# Patient Record
Sex: Female | Born: 1965
Health system: Southern US, Community
[De-identification: ages and names within clinical notes are randomized; demographics above are authoritative.]

## PROBLEM LIST (undated history)

## (undated) DIAGNOSIS — R011 Cardiac murmur, unspecified: Secondary | ICD-10-CM

## (undated) DIAGNOSIS — G8929 Other chronic pain: Secondary | ICD-10-CM

## (undated) DIAGNOSIS — J849 Interstitial pulmonary disease, unspecified: Secondary | ICD-10-CM

## (undated) DIAGNOSIS — M5430 Sciatica, unspecified side: Secondary | ICD-10-CM

## (undated) DIAGNOSIS — I10 Essential (primary) hypertension: Secondary | ICD-10-CM

## (undated) DIAGNOSIS — M1990 Unspecified osteoarthritis, unspecified site: Secondary | ICD-10-CM

## (undated) DIAGNOSIS — J189 Pneumonia, unspecified organism: Secondary | ICD-10-CM

## (undated) HISTORY — PX: BACK SURGERY: SHX140

## (undated) HISTORY — PX: ABDOMINAL HYSTERECTOMY: SHX81

## (undated) HISTORY — DX: Cardiac murmur, unspecified: R01.1

---

## 2014-04-17 DIAGNOSIS — Z90711 Acquired absence of uterus with remaining cervical stump: Secondary | ICD-10-CM

## 2014-04-17 DIAGNOSIS — R87610 Atypical squamous cells of undetermined significance on cytologic smear of cervix (ASC-US): Secondary | ICD-10-CM | POA: Insufficient documentation

## 2014-04-17 HISTORY — DX: Acquired absence of uterus with remaining cervical stump: Z90.711

## 2014-04-17 HISTORY — DX: Atypical squamous cells of undetermined significance on cytologic smear of cervix (ASC-US): R87.610

## 2014-10-05 LAB — HM HEPATITIS C SCREENING LAB: HM Hepatitis Screen: NEGATIVE

## 2015-12-26 DIAGNOSIS — Z9889 Other specified postprocedural states: Secondary | ICD-10-CM | POA: Insufficient documentation

## 2015-12-26 HISTORY — DX: Other specified postprocedural states: Z98.890

## 2020-05-12 HISTORY — PX: COLONOSCOPY: SHX174

## 2021-01-16 ENCOUNTER — Emergency Department (HOSPITAL_BASED_OUTPATIENT_CLINIC_OR_DEPARTMENT_OTHER)
Admission: EM | Admit: 2021-01-16 | Discharge: 2021-01-16 | Disposition: A | Discharge status: Home / Self Care | Payer: Medicare (Managed Care) | Attending: Emergency Medicine | Admitting: Emergency Medicine

## 2021-01-16 ENCOUNTER — Other Ambulatory Visit: Payer: Self-pay

## 2021-01-16 ENCOUNTER — Encounter (HOSPITAL_BASED_OUTPATIENT_CLINIC_OR_DEPARTMENT_OTHER): Payer: Self-pay

## 2021-01-16 ENCOUNTER — Ambulatory Visit: Payer: Medicare Other

## 2021-01-16 DIAGNOSIS — B309 Viral conjunctivitis, unspecified: Secondary | ICD-10-CM

## 2021-01-16 DIAGNOSIS — I10 Essential (primary) hypertension: Secondary | ICD-10-CM | POA: Insufficient documentation

## 2021-01-16 DIAGNOSIS — H109 Unspecified conjunctivitis: Secondary | ICD-10-CM | POA: Insufficient documentation

## 2021-01-16 HISTORY — DX: Sciatica, unspecified side: M54.30

## 2021-01-16 HISTORY — DX: Interstitial pulmonary disease, unspecified: J84.9

## 2021-01-16 HISTORY — DX: Pneumonia, unspecified organism: J18.9

## 2021-01-16 HISTORY — DX: Other chronic pain: G89.29

## 2021-01-16 HISTORY — DX: Essential (primary) hypertension: I10

## 2021-01-16 HISTORY — DX: Unspecified osteoarthritis, unspecified site: M19.90

## 2021-01-16 MED ORDER — HYPROMELLOSE (GONIOSCOPIC) 2.5 % OP SOLN: 2.0000 [drp] | Freq: Three times a day (TID) | OPHTHALMIC | 12 refills | Status: DC

## 2021-01-16 NOTE — Discharge Instructions (Addendum)
Throw away all of-year-old eye make-up brushes that you have used in the last 24 hours.  He started having changes in vision, loss of vision, painful vision please return back to ED for additional evaluation. apply the artificial tears to your eye twice daily to help with symptoms.Wash your hands and keep surfaces clean as this is very contagious.

## 2021-01-16 NOTE — ED Provider Notes (Addendum)
[facility] EMERGENCY DEPARTMENT Provider Note   CSN: [id number] Arrival date & time: 01/16/21  1133     History Chief Complaint  Patient presents with   Conjunctivitis    Caitlin White is a 55 y.o. female.   Conjunctivitis   Patient presents with right red eye.  States that she woke up this morning with erythema and crusty discharge on her right eye.  It is not painful or itchy, but is associated with tearing.  States that her daughter had pinkeye a week ago.  She does not wear any contacts, she is not having any vision changes, no blurry vision, no headaches.  He is not having any other associated symptoms.  Past Medical History:  Diagnosis Date   Arthritis    Chronic pain    Hypertension    Interstitial lung disease ([place])    Pneumonia    Sciatica     There are no problems to display for this patient.   Past Surgical History:  Procedure Laterality Date   ABDOMINAL HYSTERECTOMY     BACK SURGERY       OB History   No obstetric history on file.     No family history on file.  Social History   Tobacco Use   Smoking status: Never   Smokeless tobacco: Never  Vaping Use   Vaping Use: Never used  Substance Use Topics   Alcohol use: Never   Drug use: Never    Home Medications Prior to Admission medications   Not on File    Allergies    Patient has no known allergies.  Review of Systems   Review of Systems  Constitutional:  Negative for fever.  Eyes:  Positive for discharge and redness. Negative for pain and itching.   Physical Exam Updated Vital Signs BP (!) 156/77 (BP Location: Right Arm)   Pulse 77   Temp 98.5 F (36.9 C) (Oral)   Resp 20   Ht '6\' 1"'$  (1.854 m)   Wt 122 kg   SpO2 100%   BMI 35.49 kg/m   Physical Exam Vitals and nursing note reviewed. Exam conducted with a chaperone present.  Constitutional:      General: She is not in acute distress.    Appearance: Normal appearance.  HENT:     Head: Normocephalic and  atraumatic.  Eyes:     General: Lids are normal. Vision grossly intact. No scleral icterus.       Right eye: No discharge.     Extraocular Movements: Extraocular movements intact.     Right eye: Normal extraocular motion and no nystagmus.     Left eye: Normal extraocular motion and no nystagmus.     Conjunctiva/sclera:     Right eye: Right conjunctiva is injected. No exudate.    Left eye: Left conjunctiva is not injected. No chemosis or exudate.    Pupils: Pupils are equal, round, and reactive to light.     Comments: Conjunctival injection to the right.  No discharge, no chemosis.  Intact to confrontation, visual fields intact.  Lid is atraumatic.  Skin:    Coloration: Skin is not jaundiced.  Neurological:     Mental Status: She is alert. Mental status is at baseline.     Coordination: Coordination normal.    ED Results / Procedures / Treatments   Labs (all labs ordered are listed, but only abnormal results are displayed) Labs Reviewed - No data to display  EKG None  Radiology No results found.  Procedures Procedures   Medications Ordered in ED Medications - No data to display  ED Course  I have reviewed the triage vital signs and the nursing notes.  Pertinent labs & imaging results that were available during my care of the patient were reviewed by me and considered in my medical decision making (see chart for details).    MDM Rules/Calculators/A&P                           Patient vitals are stable, she is not having any visual deficits.  She not having any eye pain or discharge at the moment.  Additionally no tearing or itching consistent with an allergic conjunctivitis.  I suspect this is most likely a viral conjunctivitis given that it started in one eye and is without mucopurulent discharge.  Additionally, patient tells me her daughter had the same symptoms a week ago and it took 2 weeks to resolve.  The daughter was applying erythromycin ointment without  significant reduction in time of symptoms, this further suggest to me that this is a viral conjunctivitis.  No rashes or history of herpes zoster.  No blepharitis, no chemosis.  Lid is atraumatic so no concern for globe rupture.  Do not suspect glaucoma or iritis.  We will treat patient symptomatically for viral conjunctivitis and have her follow-up if needed.  Final Clinical Impression(s) / ED Diagnoses Final diagnoses:  None    Rx / DC Orders ED Discharge Orders     None        Sherrill Raring, PA-C 01/16/21 1219    Sherrill Raring, PA-C 01/16/21 1423    Godfrey Pick, MD 01/17/21 [phone]

## 2021-01-16 NOTE — ED Triage Notes (Signed)
Pt state she woke this am with redness/drainage to right eye-NAD-steady gait

## 2021-02-24 ENCOUNTER — Emergency Department (HOSPITAL_BASED_OUTPATIENT_CLINIC_OR_DEPARTMENT_OTHER): Payer: Medicare HMO

## 2021-02-24 ENCOUNTER — Emergency Department (HOSPITAL_BASED_OUTPATIENT_CLINIC_OR_DEPARTMENT_OTHER)
Admission: EM | Admit: 2021-02-24 | Discharge: 2021-02-24 | Disposition: A | Discharge status: Home / Self Care | Payer: Medicare HMO | Attending: Emergency Medicine | Admitting: Emergency Medicine

## 2021-02-24 ENCOUNTER — Other Ambulatory Visit: Payer: Self-pay

## 2021-02-24 DIAGNOSIS — H1033 Unspecified acute conjunctivitis, bilateral: Secondary | ICD-10-CM

## 2021-02-24 DIAGNOSIS — R109 Unspecified abdominal pain: Secondary | ICD-10-CM | POA: Diagnosis not present

## 2021-02-24 DIAGNOSIS — H5789 Other specified disorders of eye and adnexa: Secondary | ICD-10-CM | POA: Insufficient documentation

## 2021-02-24 DIAGNOSIS — R6883 Chills (without fever): Secondary | ICD-10-CM | POA: Diagnosis not present

## 2021-02-24 DIAGNOSIS — R5381 Other malaise: Secondary | ICD-10-CM | POA: Diagnosis not present

## 2021-02-24 DIAGNOSIS — I10 Essential (primary) hypertension: Secondary | ICD-10-CM | POA: Insufficient documentation

## 2021-02-24 DIAGNOSIS — R110 Nausea: Secondary | ICD-10-CM | POA: Insufficient documentation

## 2021-02-24 DIAGNOSIS — H1031 Unspecified acute conjunctivitis, right eye: Secondary | ICD-10-CM | POA: Diagnosis not present

## 2021-02-24 LAB — COMPREHENSIVE METABOLIC PANEL
ALT: 20 U/L (ref 0–44)
AST: 19 U/L (ref 15–41)
Albumin: 3.8 g/dL (ref 3.5–5.0)
Alkaline Phosphatase: 63 U/L (ref 38–126)
Anion gap: 5 (ref 5–15)
BUN: 11 mg/dL (ref 6–20)
CO2: 29 mmol/L (ref 22–32)
Calcium: 8.9 mg/dL (ref 8.9–10.3)
Chloride: 100 mmol/L (ref 98–111)
Creatinine, Ser: 0.88 mg/dL (ref 0.44–1.00)
GFR, Estimated: >60 mL/min (ref >60)
Glucose, Bld: 95 mg/dL (ref 70–99)
Potassium: 3.7 mmol/L (ref 3.5–5.1)
Sodium: 134 mmol/L — ABNORMAL LOW (ref 135–145)
Total Bilirubin: 0.2 mg/dL — ABNORMAL LOW (ref 0.3–1.2)
Total Protein: 7.8 g/dL (ref 6.5–8.1)

## 2021-02-24 LAB — URINALYSIS, ROUTINE W REFLEX MICROSCOPIC
Bilirubin Urine: NEGATIVE
Glucose, UA: NEGATIVE mg/dL
Hgb urine dipstick: NEGATIVE
Ketones, ur: NEGATIVE mg/dL
Leukocytes,Ua: NEGATIVE
Nitrite: NEGATIVE
Protein, ur: NEGATIVE mg/dL
Specific Gravity, Urine: 1.02 (ref 1.005–1.030)
pH: 7 (ref 5.0–8.0)

## 2021-02-24 LAB — CBC
HCT: 34.3 % — ABNORMAL LOW (ref 36.0–46.0)
Hemoglobin: 11.7 g/dL — ABNORMAL LOW (ref 12.0–15.0)
MCH: 31.9 pg (ref 26.0–34.0)
MCHC: 34.1 g/dL (ref 30.0–36.0)
MCV: 93.5 fL (ref 80.0–100.0)
Platelets: 321 10*3/uL (ref 150–400)
RBC: 3.67 MIL/uL — ABNORMAL LOW (ref 3.87–5.11)
RDW: 12.8 % (ref 11.5–15.5)
WBC: 5.1 10*3/uL (ref 4.0–10.5)
nRBC: 0 % (ref 0.0–0.2)

## 2021-02-24 LAB — LIPASE, BLOOD: Lipase: 26 U/L (ref 11–51)

## 2021-02-24 MED ORDER — METOPROLOL SUCCINATE ER 25 MG PO TB24: 50.0000 mg | ORAL_TABLET | Freq: Every day | ORAL | 0 refills | Status: DC

## 2021-02-24 MED ORDER — LACTATED RINGERS IV BOLUS
1000.0000 mL | Freq: Once | INTRAVENOUS | Status: AC
  Administered 2021-02-24: 1000 mL via INTRAVENOUS

## 2021-02-24 MED ORDER — NIFEDIPINE ER OSMOTIC RELEASE 60 MG PO TB24: 60.0000 mg | ORAL_TABLET | Freq: Every day | ORAL | 0 refills | Status: DC

## 2021-02-24 MED ORDER — LEVOTHYROXINE SODIUM 175 MCG PO TABS: 175.0000 ug | ORAL_TABLET | Freq: Every day | ORAL | 0 refills | Status: DC

## 2021-02-24 MED ORDER — IOHEXOL 300 MG/ML  SOLN
100.0000 mL | Freq: Once | INTRAMUSCULAR | Status: AC | PRN
  Administered 2021-02-24: 100 mL via INTRAVENOUS

## 2021-02-24 MED ORDER — ERYTHROMYCIN 5 MG/GM OP OINT: TOPICAL_OINTMENT | OPHTHALMIC | 0 refills | Status: DC

## 2021-02-24 MED ORDER — ONDANSETRON HCL 4 MG/2ML IJ SOLN
4.0000 mg | Freq: Once | INTRAMUSCULAR | Status: AC
  Administered 2021-02-24: 4 mg via INTRAVENOUS
  Filled 2021-02-24: qty 2

## 2021-02-24 MED ORDER — ONDANSETRON 4 MG PO TBDP: 4.0000 mg | ORAL_TABLET | Freq: Three times a day (TID) | ORAL | 0 refills | Status: DC | PRN

## 2021-02-24 MED ORDER — POTASSIUM CHLORIDE CRYS ER 20 MEQ PO TBCR: 20.0000 meq | EXTENDED_RELEASE_TABLET | Freq: Every day | ORAL | 0 refills | Status: DC

## 2021-02-24 NOTE — ED Notes (Signed)
Pt NAD, a/ox4. Pt verbalizes understanding of all DC and f/u instructions. All questions answered. Pt walks with steady gait to lobby at DC.  ? ?

## 2021-02-24 NOTE — ED Provider Notes (Signed)
[facility] EMERGENCY DEPARTMENT Provider Note   CSN: [id number] Arrival date & time: 02/24/21  1639     History Chief Complaint  Patient presents with   Nausea    Caitlin White is a 55 y.o. female Who presents with 3 days of nausea, bilateral eye itching and crusting worse with morning, and generalized malaise.  She denies any vomiting, diarrhea, fevers but does endorse chills.  I personally read this patient's medical records.  She is history of hypertension, sciatica, interstitial lung disease, and chronic back pain.  HPI     Past Medical History:  Diagnosis Date   Arthritis    Chronic pain    Hypertension    Interstitial lung disease ([place])    Pneumonia    Sciatica     There are no problems to display for this patient.   Past Surgical History:  Procedure Laterality Date   ABDOMINAL HYSTERECTOMY     BACK SURGERY       OB History   No obstetric history on file.     No family history on file.  Social History   Tobacco Use   Smoking status: Never   Smokeless tobacco: Never  Vaping Use   Vaping Use: Never used  Substance Use Topics   Alcohol use: Never   Drug use: Never    Home Medications Prior to Admission medications   Medication Sig Start Date End Date Taking? Authorizing Provider  hydroxypropyl methylcellulose / hypromellose (ISOPTO TEARS / GONIOVISC) 2.5 % ophthalmic solution Place 2 drops into both eyes 3 (three) times daily. 01/16/21   Sherrill Raring, PA-C    Allergies    Patient has no known allergies.  Review of Systems   Review of Systems  Constitutional:  Positive for activity change, appetite change, chills and fatigue. Negative for fever.  HENT:  Positive for congestion. Negative for trouble swallowing and voice change.   Eyes:  Positive for discharge and itching.  Respiratory: Negative.    Cardiovascular: Negative.   Gastrointestinal:  Positive for abdominal pain and nausea. Negative for diarrhea and vomiting.   Genitourinary: Negative.   Musculoskeletal: Negative.   Skin: Negative.   Neurological: Negative.    Physical Exam Updated Vital Signs BP (!) 152/70   Pulse 63   Temp 98.1 F (36.7 C) (Oral)   Resp 18   Ht 6\' 1"  (1.854 m)   Wt 121.6 kg   SpO2 100%   BMI 35.36 kg/m   Physical Exam Vitals and nursing note reviewed.  Constitutional:      Appearance: She is obese. She is not ill-appearing or toxic-appearing.  HENT:     Head: Normocephalic and atraumatic.     Nose: Nose normal. No congestion.     Mouth/Throat:     Mouth: Mucous membranes are moist.     Pharynx: Oropharynx is clear. Uvula midline. No oropharyngeal exudate or posterior oropharyngeal erythema.     Tonsils: No tonsillar exudate.  Eyes:     General: Lids are normal. Vision grossly intact.        Right eye: No discharge.        Left eye: No discharge.     Extraocular Movements: Extraocular movements intact.     Conjunctiva/sclera:     Right eye: Right conjunctiva is injected. Chemosis present.     Left eye: Left conjunctiva is injected.     Pupils: Pupils are equal, round, and reactive to light.  Neck:     Trachea: Trachea and phonation  normal.  Cardiovascular:     Rate and Rhythm: Normal rate and regular rhythm.     Pulses: Normal pulses.     Heart sounds: Normal heart sounds. No murmur heard. Pulmonary:     Effort: Pulmonary effort is normal. No tachypnea, bradypnea, accessory muscle usage, prolonged expiration or respiratory distress.     Breath sounds: Normal breath sounds. No wheezing or rales.  Chest:     Chest wall: No mass, lacerations, deformity, swelling, tenderness, crepitus or edema.  Abdominal:     General: Bowel sounds are normal. There is no distension.     Palpations: Abdomen is soft.     Tenderness: There is generalized abdominal tenderness. There is no right CVA tenderness, left CVA tenderness, guarding or rebound.  Musculoskeletal:        General: No deformity.     Cervical back:  Normal range of motion and neck supple. No edema, rigidity, tenderness or crepitus. No pain with movement, spinous process tenderness or muscular tenderness.     Right lower leg: No edema.     Left lower leg: No edema.  Lymphadenopathy:     Cervical: No cervical adenopathy.  Skin:    General: Skin is warm and dry.     Capillary Refill: Capillary refill takes less than 2 seconds.     Findings: No rash.  Neurological:     General: No focal deficit present.     Mental Status: She is alert and oriented to person, place, and time. Mental status is at baseline.     GCS: GCS eye subscore is 4. GCS verbal subscore is 5. GCS motor subscore is 6.     Gait: Gait is intact. Gait normal.  Psychiatric:        Mood and Affect: Mood normal.    ED Results / Procedures / Treatments   Labs (all labs ordered are listed, but only abnormal results are displayed) Labs Reviewed  COMPREHENSIVE METABOLIC PANEL - Abnormal; Notable for the following components:      Result Value   Sodium 134 (*)    Total Bilirubin 0.2 (*)    All other components within normal limits  CBC - Abnormal; Notable for the following components:   RBC 3.67 (*)    Hemoglobin 11.7 (*)    HCT 34.3 (*)    All other components within normal limits  LIPASE, BLOOD  URINALYSIS, ROUTINE W REFLEX MICROSCOPIC    EKG None  Radiology CT ABDOMEN PELVIS W CONTRAST  Result Date: 02/24/2021 CLINICAL DATA:  Nausea and abdominal pain. EXAM: CT ABDOMEN AND PELVIS WITH CONTRAST TECHNIQUE: Multidetector CT imaging of the abdomen and pelvis was performed using the standard protocol following bolus administration of intravenous contrast. CONTRAST:  171mL OMNIPAQUE IOHEXOL 300 MG/ML  SOLN COMPARISON:  None. FINDINGS: Lower chest: 3 mm right lower lobe pulmonary nodule on image 12/4. 3 mm left lower lobe pulmonary nodule on image 14/4. Hepatobiliary: No suspicious hepatic lesion. Gallbladder is unremarkable. No biliary ductal dilation. Pancreas: No  pancreatic ductal dilation or evidence of acute inflammation. Spleen: Within normal limits. Adrenals/Urinary Tract: Adrenal glands are unremarkable. Kidneys are normal, without renal calculi, solid enhancing lesion, or hydronephrosis. Bladder is unremarkable for degree of distension. Stomach/Bowel: Postsurgical changes of prior gastric surgery. Small hiatal hernia otherwise the stomach is unremarkable. No pathologic dilation of small or large bowel. The appendix and terminal ileum appear normal. Scattered sigmoid colonic diverticulosis without findings of acute diverticulitis. Vascular/Lymphatic: No abdominal aortic aneurysm. No pathologically enlarged abdominal  or pelvic lymph nodes. Reproductive: Small bilateral simple appearing ovarian cysts measuring 2.3 cm on the right and 1.8 cm on the left. Uterus is unremarkable. Other: No abdominopelvic ascites. Small fat containing ventral hernia. Musculoskeletal: T9-L1 posterior spinal fusion hardware multilevel degenerative changes spine. Chronic changes of osteitis pubis. There are multifocal sclerotic osseous lesions for instance in the left posterior T7 vertebral body extending into the posterior elements on coronal image 34/5, the left ischium on coronal image 34/5, and S1 vertebral body on image 66/6. There is dense sclerosis of the left T11 pedicle/posterior elements. IMPRESSION: 1. No acute findings in the abdomen or pelvis. 2. Multiple sclerotic osseous lesions as described above, nonspecific but possibly sequela of chronic underlying metabolic disease or reflecting multifocal osseous metastatic disease. Consider further evaluation with nonemergent MRI spine with and without contrast as clinically indicated. 3. Scattered sigmoid colonic diverticulosis without findings of acute diverticulitis. 4. Multiple pulmonary nodules. Measuring up to 3 mm. No routine follow-up imaging is recommended per Fleischner Society Guidelines. These guidelines do not apply to  immunocompromised patients and patients with cancer. Follow up in patients with significant comorbidities as clinically warranted. For lung cancer screening, adhere to Lung-RADS guidelines. Reference: Radiology. 2017; 284(1):228-43. 5. Multiple simple appearing ovarian cysts measuring up to 2.3 cm. No follow-up imaging is recommended. Reference: JACR 2020 Feb;17(2):248-254 Electronically Signed   By: Dahlia Bailiff M.D.   On: 02/24/2021 20:54    Procedures Procedures   Medications Ordered in ED Medications  lactated ringers bolus 1,000 mL ( Intravenous Stopped 02/24/21 2137)  ondansetron (ZOFRAN) injection 4 mg (4 mg Intravenous Given 02/24/21 1946)  iohexol (OMNIPAQUE) 300 MG/ML solution 100 mL (100 mLs Intravenous Contrast Given 02/24/21 2025)    ED Course  I have reviewed the triage vital signs and the nursing notes.  Pertinent labs & imaging results that were available during my care of the patient were reviewed by me and considered in my medical decision making (see chart for details).    MDM Rules/Calculators/A&P                         53 times a day 3 days of nausea, bilateral itching and drainage from the eyes, generalized malaise.  Diagnoses broad includes but is not limited to acute viral upper respiratory infection, gastroenteritis, sepsis, dehydration/hypovolemia.  Hypertensive on intake and vital signs otherwise normal.  Cardiopulmonary exam is normal, abdominal exam is generally tender to palpation without rebound or guarding.  Bilateral conjunctive are injected with chemosis from the right eye.  Patient is neurovascular intact in all 4 extremities and ambulates in the emergency department out difficulty.  No focal deficit on neuro exam.  CBC with mild anemia with hemoglobin of 11.7.  CMP with mild hyponatremia 134, lipase is normal.  Urine noninfectious appearing.  CT without acute finding in the abdomen or pelvis.  Some sclerotic lesions of the spine which should be  followed up with further imaging in the outpatient setting.  Patient reevaluated after fluid bolus and antiemetic with improvement in her symptoms.  No further work-up warranted in the ER at this time.  Will discharge with prescriptions for her home medications which she is out of (patient relocating from [place]).  Additionally will discharge with as needed Zofran prescription.  Patient to see her new primary care doctor on 03/11/2021.  Suspect acute viral illness as the etiology of her symptoms.  No further work-up warranted in the ER at this time.  Zahraa voiced understanding of her medical evaluation and treatment plan.  Each of her questions was answered to her expressed satisfaction.  Return precautions were given.  Patient is well-appearing, stable, and appropriate for discharge at this time.  This chart was dictated using voice recognition software, Dragon. Despite the best efforts of this provider to proofread and correct errors, errors may still occur which can change documentation meaning.   Final Clinical Impression(s) / ED Diagnoses Final diagnoses:  None    Rx / DC Orders ED Discharge Orders     None        Aura Dials 02/24/21 2217    Godfrey Pick, MD 02/26/21 1551

## 2021-02-24 NOTE — Discharge Instructions (Addendum)
You are seen in the ER today for your nausea and your eye discharge.  You are diagnosed with acute viral illness.  You have been prescribed antibiotic ointment to treat your eye infection.  Additionally been prescribed as needed Zofran which is a nausea medication.  Your home medications were refilled in the ER.  Please follow-up with your PCP as scheduled for 03/11/2021 and return to the ER with any new severe symptoms.  Increase your hydration at home.

## 2021-02-24 NOTE — ED Triage Notes (Signed)
Pt states she thinks she has pink eye in her right eye. Has crust and itching. Also c/o nausea. States she is out of her potassium and nifedepine

## 2021-02-28 DIAGNOSIS — H524 Presbyopia: Secondary | ICD-10-CM | POA: Diagnosis not present

## 2021-02-28 DIAGNOSIS — Z0100 Encounter for examination of eyes and vision without abnormal findings: Secondary | ICD-10-CM | POA: Diagnosis not present

## 2021-03-11 DIAGNOSIS — R946 Abnormal results of thyroid function studies: Secondary | ICD-10-CM | POA: Diagnosis not present

## 2021-03-11 DIAGNOSIS — Z13220 Encounter for screening for lipoid disorders: Secondary | ICD-10-CM | POA: Diagnosis not present

## 2021-03-11 DIAGNOSIS — Z6836 Body mass index (BMI) 36.0-36.9, adult: Secondary | ICD-10-CM | POA: Diagnosis not present

## 2021-03-11 DIAGNOSIS — Z1231 Encounter for screening mammogram for malignant neoplasm of breast: Secondary | ICD-10-CM | POA: Diagnosis not present

## 2021-03-11 DIAGNOSIS — Z1211 Encounter for screening for malignant neoplasm of colon: Secondary | ICD-10-CM | POA: Diagnosis not present

## 2021-03-11 DIAGNOSIS — Z0000 Encounter for general adult medical examination without abnormal findings: Secondary | ICD-10-CM | POA: Diagnosis not present

## 2021-03-11 DIAGNOSIS — E669 Obesity, unspecified: Secondary | ICD-10-CM | POA: Diagnosis not present

## 2021-03-11 DIAGNOSIS — I10 Essential (primary) hypertension: Secondary | ICD-10-CM | POA: Diagnosis not present

## 2021-03-11 DIAGNOSIS — Z23 Encounter for immunization: Secondary | ICD-10-CM | POA: Diagnosis not present

## 2021-03-11 DIAGNOSIS — Z124 Encounter for screening for malignant neoplasm of cervix: Secondary | ICD-10-CM | POA: Diagnosis not present

## 2021-03-11 DIAGNOSIS — E890 Postprocedural hypothyroidism: Secondary | ICD-10-CM | POA: Diagnosis not present

## 2021-03-11 DIAGNOSIS — Z981 Arthrodesis status: Secondary | ICD-10-CM | POA: Diagnosis not present

## 2021-03-15 ENCOUNTER — Institutional Professional Consult (permissible substitution): Payer: Medicare Other | Admitting: Pulmonary Disease

## 2021-03-20 ENCOUNTER — Other Ambulatory Visit: Payer: Self-pay

## 2021-03-20 ENCOUNTER — Encounter: Payer: Self-pay | Admitting: Pulmonary Disease

## 2021-03-20 ENCOUNTER — Ambulatory Visit (INDEPENDENT_AMBULATORY_CARE_PROVIDER_SITE_OTHER): Payer: Medicare HMO | Admitting: Pulmonary Disease

## 2021-03-20 VITALS — BP 124/64 | HR 78 | Temp 98.5°F | Ht 73.0 in | Wt 271.4 lb

## 2021-03-20 DIAGNOSIS — J849 Interstitial pulmonary disease, unspecified: Secondary | ICD-10-CM | POA: Diagnosis not present

## 2021-03-20 NOTE — Progress Notes (Addendum)
Caitlin White    [id number]    11/03/1965  Primary Care Physician:Pcp, No  Referring Physician: No referring provider defined for this encounter.  Chief complaint: Consult for interstitial lung disease  HPI: 55 year old with history of unspecified interstitial lung disease, sleep apnea She has moved here from [place] and is here to establish care Previously followed by Dr. Nyra Jabs in [place], [place]  As per the patient she had severe bilateral pneumonia in 2013 requiring hospitalizations and fluid drainage.  She developed interstitial lung disease after that but does not recall the specific details.  She has been followed by pulmonary since then for monitoring and is not on any specific treatment History also notable for sleep apnea and has been on CPAP for the past 3 years  At present denies any dyspnea, fevers, chills  Pets: No pets Occupation: Disabled since 2021.  Used to work as a Engineer, manufacturing Exposures: No mold, hot tub, Jacuzzi.  No feather pillows or comforters Smoking history: Never smoker Travel history: Originally from [place].  Moved to [place] in 2022 Relevant family history: No significant family history of lung disease  Outpatient Encounter Medications as of 03/20/2021  Medication Sig   levothyroxine (SYNTHROID) 175 MCG tablet Take 1 tablet (175 mcg total) by mouth daily before breakfast.   losartan (COZAAR) 100 MG tablet Take by mouth.   metoprolol succinate (TOPROL-XL) 25 MG 24 hr tablet Take 2 tablets (50 mg total) by mouth daily.   NIFEdipine (PROCARDIA XL/NIFEDICAL XL) 60 MG 24 hr tablet Take 1 tablet (60 mg total) by mouth daily.   omeprazole (PRILOSEC) 20 MG capsule Take 20 mg by mouth daily.   potassium chloride SA (KLOR-CON) 20 MEQ tablet Take 1 tablet (20 mEq total) by mouth daily for 15 days.   VITAMIN D PO Take by mouth.   [DISCONTINUED] erythromycin ophthalmic ointment Place a 1/2 inch ribbon of ointment into the  lower eyelid four times daily x 5 days.   [DISCONTINUED] hydroxypropyl methylcellulose / hypromellose (ISOPTO TEARS / GONIOVISC) 2.5 % ophthalmic solution Place 2 drops into both eyes 3 (three) times daily.   [DISCONTINUED] ondansetron (ZOFRAN ODT) 4 MG disintegrating tablet Take 1 tablet (4 mg total) by mouth every 8 (eight) hours as needed for nausea or vomiting.   No facility-administered encounter medications on file as of 03/20/2021.    Allergies as of 03/20/2021   (No Known Allergies)    Past Medical History:  Diagnosis Date   Arthritis    Chronic pain    Hypertension    Interstitial lung disease ([place])    Pneumonia    Sciatica     Past Surgical History:  Procedure Laterality Date   ABDOMINAL HYSTERECTOMY     BACK SURGERY      No family history on file.  Social History   Socioeconomic History   Marital status: Married    Spouse name: Not on file   Number of children: Not on file   Years of education: Not on file   Highest education level: Not on file  Occupational History   Not on file  Tobacco Use   Smoking status: Never   Smokeless tobacco: Never  Vaping Use   Vaping Use: Never used  Substance and Sexual Activity   Alcohol use: Never   Drug use: Never   Sexual activity: Not on file  Other Topics Concern   Not on file  Social History  Narrative   Not on file   Social Determinants of Health   Financial Resource Strain: Not on file  Food Insecurity: Not on file  Transportation Needs: Not on file  Physical Activity: Not on file  Stress: Not on file  Social Connections: Not on file  Intimate Partner Violence: Not on file    Review of systems: Review of Systems  Constitutional: Negative for fever and chills.  HENT: Negative.   Eyes: Negative for blurred vision.  Respiratory: as per HPI  Cardiovascular: Negative for chest pain and palpitations.  Gastrointestinal: Negative for vomiting, diarrhea, blood per rectum. Genitourinary: Negative for  dysuria, urgency, frequency and hematuria.  Musculoskeletal: Negative for myalgias, back pain and joint pain.  Skin: Negative for itching and rash.  Neurological: Negative for dizziness, tremors, focal weakness, seizures and loss of consciousness.  Endo/Heme/Allergies: Negative for environmental allergies.  Psychiatric/Behavioral: Negative for depression, suicidal ideas and hallucinations.  All other systems reviewed and are negative.  Physical Exam: Blood pressure 124/64, pulse 78, temperature 98.5 F (36.9 C), temperature source Oral, height 6\' 1"  (1.854 m), weight 271 lb 6.4 oz (123.1 kg), SpO2 99 %. Gen:      No acute distress HEENT:  EOMI, sclera anicteric Neck:     No masses; no thyromegaly Lungs:    Clear to auscultation bilaterally; normal respiratory effort CV:         Regular rate and rhythm; no murmurs Abd:      + bowel sounds; soft, non-tender; no palpable masses, no distension Ext:    No edema; adequate peripheral perfusion Skin:      Warm and dry; no rash Neuro: alert and oriented x 3 Psych: normal mood and affect  Data Reviewed: Imaging: CT abdomen pelvis 02/24/2021-visualized lung bases are clear.  Multiple pulmonary nodules measuring up to 3 mm.  I have reviewed the images personally.  PFTs:  Labs:  Assessment:  Evaluation of interstitial lung disease She has unspecified interstitial lung disease after hospitalization for pneumonia in 2013 Not currently on any treatment Will need to get records from her pulmonary doctor in [place]  Schedule high-res CT and PFTs for evaluation of the lungs  OSA On CPAP Obtain records of sleep study  Plan/Recommendations: Obtain records High-res CT, PFTs  Marshell Garfinkel MD [facility] 03/20/2021, 3:33 PM  CC: No ref. provider found  Addendum: Received clinic notes from patient's pulmonologist Dr. Modena Nunnery dated 07/18/2020.  Review of anxiety  Patient has history of interstitial  lung disease, nonspecific ILD.   \Open lung biopsy right lower lobe on 06/10/2012, she completed a long course of steroids with complete resolution of infiltrates  Core IR biopsy of right middle lobe nodule on 08/17/2015-patchy chronic interstitial inflammation and alveolar histiocytes, negative for granulomata, vasculitis or neoplasm.  She completed 2 months of prednisone from April to May 2017  She follows Dr. Chriss Czar for evaluation of numeric hyperintense of lesions affecting vertebral bodies which is stable for 4 years.  History of total thyroidectomy for thyroid nodules in November 2020  Has history of OSA on CPAP  Marshell Garfinkel MD [facility] 05/22/2021, 1:34 PM

## 2021-03-20 NOTE — Patient Instructions (Signed)
We get records from your previous pulmonary doctor for our review Get high-res CT and PFTs for better evaluation of lung Follow-up in 1 to 2 months

## 2021-04-02 ENCOUNTER — Ambulatory Visit (INDEPENDENT_AMBULATORY_CARE_PROVIDER_SITE_OTHER)
Admission: RE | Admit: 2021-04-02 | Discharge: 2021-04-02 | Disposition: A | Discharge status: Home / Self Care | Payer: Medicare HMO | Source: Ambulatory Visit | Attending: Pulmonary Disease | Admitting: Pulmonary Disease

## 2021-04-02 ENCOUNTER — Other Ambulatory Visit: Payer: Self-pay

## 2021-04-02 DIAGNOSIS — J8410 Pulmonary fibrosis, unspecified: Secondary | ICD-10-CM | POA: Diagnosis not present

## 2021-04-02 DIAGNOSIS — C7951 Secondary malignant neoplasm of bone: Secondary | ICD-10-CM | POA: Diagnosis not present

## 2021-04-02 DIAGNOSIS — J849 Interstitial pulmonary disease, unspecified: Secondary | ICD-10-CM

## 2021-04-02 DIAGNOSIS — K449 Diaphragmatic hernia without obstruction or gangrene: Secondary | ICD-10-CM | POA: Diagnosis not present

## 2021-04-08 ENCOUNTER — Other Ambulatory Visit: Payer: Self-pay | Admitting: *Deleted

## 2021-04-08 DIAGNOSIS — R9389 Abnormal findings on diagnostic imaging of other specified body structures: Secondary | ICD-10-CM

## 2021-04-08 DIAGNOSIS — M899 Disorder of bone, unspecified: Secondary | ICD-10-CM

## 2021-04-08 NOTE — Addendum Note (Signed)
Addended by: Elton Sin on: 04/08/2021 11:33 AM   Modules accepted: Orders

## 2021-04-17 ENCOUNTER — Other Ambulatory Visit: Payer: Self-pay

## 2021-04-17 ENCOUNTER — Ambulatory Visit (HOSPITAL_COMMUNITY)
Admission: RE | Admit: 2021-04-17 | Discharge: 2021-04-17 | Disposition: A | Discharge status: Home / Self Care | Payer: Medicare HMO | Source: Ambulatory Visit | Attending: Pulmonary Disease | Admitting: Pulmonary Disease

## 2021-04-17 DIAGNOSIS — C7951 Secondary malignant neoplasm of bone: Secondary | ICD-10-CM | POA: Diagnosis not present

## 2021-04-17 DIAGNOSIS — R590 Localized enlarged lymph nodes: Secondary | ICD-10-CM | POA: Diagnosis not present

## 2021-04-17 DIAGNOSIS — M899 Disorder of bone, unspecified: Secondary | ICD-10-CM | POA: Diagnosis not present

## 2021-04-17 DIAGNOSIS — R918 Other nonspecific abnormal finding of lung field: Secondary | ICD-10-CM | POA: Diagnosis not present

## 2021-04-17 DIAGNOSIS — R9389 Abnormal findings on diagnostic imaging of other specified body structures: Secondary | ICD-10-CM | POA: Diagnosis not present

## 2021-04-17 DIAGNOSIS — M47814 Spondylosis without myelopathy or radiculopathy, thoracic region: Secondary | ICD-10-CM | POA: Diagnosis not present

## 2021-04-17 LAB — GLUCOSE, CAPILLARY: Glucose-Capillary: 106 mg/dL — ABNORMAL HIGH (ref 70–99)

## 2021-04-17 MED ORDER — FLUDEOXYGLUCOSE F - 18 (FDG) INJECTION
13.5000 | Freq: Once | INTRAVENOUS | Status: AC | PRN
  Administered 2021-04-17: 14 via INTRAVENOUS

## 2021-04-22 ENCOUNTER — Telehealth: Payer: Self-pay | Admitting: Pulmonary Disease

## 2021-04-22 DIAGNOSIS — M899 Disorder of bone, unspecified: Secondary | ICD-10-CM

## 2021-04-22 NOTE — Telephone Encounter (Signed)
I called to discuss PET scan results from 04/18/21 but there was no answer and voice mail box was full.   Can you please try again and let her know that scan shows some lesion in the bones that is indeterminate. I would like her to see Oncology to get their opinion. Please make a referral

## 2021-04-23 ENCOUNTER — Telehealth: Payer: Self-pay | Admitting: Oncology

## 2021-04-23 NOTE — Telephone Encounter (Signed)
[facility] Scheduling Phone Note  I contacted Caitlin White regarding a referral from Dr. Vaughan Browner for purpose of evaluation and work up of bone lesions.  Caitlin White was aware of her referral and reason.  Information on reason for referral was not necessary.  Of note, she did sound like I had awoken her with my call - I did ask her to confirm back the findings as she understood them resulting in the reason for referral.  Caitlin White is very pleasant and knowledgeable about what prompted the referral to hematology/oncology.  Patient was informed about the [facility] and the intent being to complete a diagnostic/prognostic work-up for her suspicious findings.  She is aware her appointment is with our Physician Assistant to identify a diagnostic plan of care and to arrange further oncologist or specialist care as indicated.  I confirmed with Judi Jaffe she has transportation to her [facility] appointment.  Patient is aware to bring a list of medications, as well as insurance cards.  Visitor policy and COVID protocol reviewed with patient as well, including what to expect on arrival to the [facility] regarding check-in process, visit, and potential for labs afterwards.  We look forward to meeting Caitlin White and completing her diagnostic work-up and arranging her subsequent appointment with our oncologist team.  Diagnostic Clinic Specific Info:  Best contact/way to contact patient:  Home/Mobile CHL updated to reflect?:  not applicable Is there a HC POA?:  No  Location of Diagnostic Clinic Appointment and Contact Info Provided to Patient: [place] [facility] [place], [place] [address] [phone]   Reason for Referral, Clinical Information:  04/02/2021 CT Chest IMPRESSION: 1. No evidence of interstitial lung disease. 2. Scattered sclerotic osseous lesions are worrisome for  metastatic disease. A site of primary malignancy is not identified in the chest. 3. Noncalcified pulmonary nodules measure 5 mm or less in size and are indeterminate. Recommend continued attention on follow-up exams as metastatic disease cannot be definitively excluded.  04/17/2021 PET IMPRESSION: Multifocal areas of bony sclerosis without substantial increased metabolic activity above mediastinal blood pool. Findings are indeterminate; however, based on distribution and appearance remaining suspicious for metastatic disease perhaps from an indolent process. Occasionally FDG PET can be falsely negative in the setting of sclerotic bone metastases. MRI may be helpful for more complete assessment, could consider imaging of the spine or pelvis.   More pronounced sclerosis and mildly increased metabolic activity about the spine at the T11 level on the LEFT within an area of spinal fusion and adjacent to costovertebral degenerative changes, more likely related to degenerative process in this area.   Misregistration artifact about the neck. Mildly enlarged lymph nodes in the neck display borderline increased metabolic activity more so on the LEFT than the RIGHT. The metabolic activity is almost certainly due to changes of misregistration and potential reactive changes in the setting of prior surgery involving the maxilla and the mandible.   Postoperative changes of partial gastrectomy.

## 2021-04-23 NOTE — Telephone Encounter (Signed)
Spoke with pt and notified of results per Dr.Mannam. Pt verbalized understanding and denied any questions. She was agreeable to the referral to oncology and I have placed this referral and marked as urgent.

## 2021-04-24 ENCOUNTER — Ambulatory Visit: Payer: Medicare HMO | Admitting: Physician Assistant

## 2021-04-24 ENCOUNTER — Other Ambulatory Visit: Payer: Medicare HMO

## 2021-04-26 ENCOUNTER — Inpatient Hospital Stay: Payer: Medicare HMO

## 2021-04-26 ENCOUNTER — Inpatient Hospital Stay: Payer: Medicare HMO | Attending: Physician Assistant | Admitting: Physician Assistant

## 2021-04-26 ENCOUNTER — Other Ambulatory Visit: Payer: Self-pay

## 2021-04-26 VITALS — BP 141/77 | HR 76 | Temp 97.5°F | Resp 20 | Wt 267.9 lb

## 2021-04-26 DIAGNOSIS — M549 Dorsalgia, unspecified: Secondary | ICD-10-CM | POA: Insufficient documentation

## 2021-04-26 DIAGNOSIS — M899 Disorder of bone, unspecified: Secondary | ICD-10-CM | POA: Diagnosis not present

## 2021-04-26 DIAGNOSIS — G8929 Other chronic pain: Secondary | ICD-10-CM | POA: Insufficient documentation

## 2021-04-26 LAB — CMP (CANCER CENTER ONLY)
ALT: 16 U/L (ref 0–44)
AST: 17 U/L (ref 15–41)
Albumin: 3.7 g/dL (ref 3.5–5.0)
Alkaline Phosphatase: 72 U/L (ref 38–126)
Anion gap: 9 (ref 5–15)
BUN: 13 mg/dL (ref 6–20)
CO2: 25 mmol/L (ref 22–32)
Calcium: 8.9 mg/dL (ref 8.9–10.3)
Chloride: 105 mmol/L (ref 98–111)
Creatinine: 0.73 mg/dL (ref 0.44–1.00)
GFR, Estimated: >60 mL/min (ref >60)
Glucose, Bld: 106 mg/dL — ABNORMAL HIGH (ref 70–99)
Potassium: 3.9 mmol/L (ref 3.5–5.1)
Sodium: 139 mmol/L (ref 135–145)
Total Bilirubin: 0.2 mg/dL — ABNORMAL LOW (ref 0.3–1.2)
Total Protein: 7.9 g/dL (ref 6.5–8.1)

## 2021-04-26 LAB — CBC WITH DIFFERENTIAL (CANCER CENTER ONLY)
Abs Immature Granulocytes: 0.01 10*3/uL (ref 0.00–0.07)
Basophils Absolute: 0 10*3/uL (ref 0.0–0.1)
Basophils Relative: 1 %
Eosinophils Absolute: 0.1 10*3/uL (ref 0.0–0.5)
Eosinophils Relative: 2 %
HCT: 33.9 % — ABNORMAL LOW (ref 36.0–46.0)
Hemoglobin: 11.6 g/dL — ABNORMAL LOW (ref 12.0–15.0)
Immature Granulocytes: 0 %
Lymphocytes Relative: 38 %
Lymphs Abs: 1.6 10*3/uL (ref 0.7–4.0)
MCH: 31.4 pg (ref 26.0–34.0)
MCHC: 34.2 g/dL (ref 30.0–36.0)
MCV: 91.9 fL (ref 80.0–100.0)
Monocytes Absolute: 0.4 10*3/uL (ref 0.1–1.0)
Monocytes Relative: 8 %
Neutro Abs: 2.1 10*3/uL (ref 1.7–7.7)
Neutrophils Relative %: 51 %
Platelet Count: 314 10*3/uL (ref 150–400)
RBC: 3.69 MIL/uL — ABNORMAL LOW (ref 3.87–5.11)
RDW: 12.6 % (ref 11.5–15.5)
WBC Count: 4.2 10*3/uL (ref 4.0–10.5)
nRBC: 0 % (ref 0.0–0.2)

## 2021-04-26 LAB — LACTATE DEHYDROGENASE: LDH: 204 U/L — ABNORMAL HIGH (ref 98–192)

## 2021-04-26 NOTE — Progress Notes (Deleted)
[place] [phone]   [phone]  INITIAL CONSULTATION:  Patient Care Team: Pcp, No as PCP - General  CHIEF COMPLAINTS/PURPOSE OF CONSULTATION:  "*** "  HISTORY OF PRESENTING ILLNESS:  Sydell Prowell 55 y.o. female with medical history significant for ***  On review of the previous records ***  On exam today ***  MEDICAL HISTORY:  Past Medical History:  Diagnosis Date   Arthritis    Chronic pain    Hypertension    Interstitial lung disease ([place])    Pneumonia    Sciatica     SURGICAL HISTORY: Past Surgical History:  Procedure Laterality Date   ABDOMINAL HYSTERECTOMY     BACK SURGERY      SOCIAL HISTORY: Social History   Socioeconomic History   Marital status: Married    Spouse name: Not on file   Number of children: Not on file   Years of education: Not on file   Highest education level: Not on file  Occupational History   Not on file  Tobacco Use   Smoking status: Never   Smokeless tobacco: Never  Vaping Use   Vaping Use: Never used  Substance and Sexual Activity   Alcohol use: Never   Drug use: Never   Sexual activity: Not on file  Other Topics Concern   Not on file  Social History Narrative   Not on file   Social Determinants of Health   Financial Resource Strain: Not on file  Food Insecurity: Not on file  Transportation Needs: Not on file  Physical Activity: Not on file  Stress: Not on file  Social Connections: Not on file  Intimate Partner Violence: Not on file    FAMILY HISTORY: No family history on file.  ALLERGIES:  has No Known Allergies.  MEDICATIONS:  Current Outpatient Medications  Medication Sig Dispense Refill   levothyroxine (SYNTHROID) 175 MCG tablet Take 1 tablet (175 mcg total) by mouth daily before breakfast. 30 tablet 0   losartan (COZAAR) 100 MG tablet Take by mouth.     metoprolol succinate (TOPROL-XL) 25 MG 24 hr tablet Take 2 tablets (50 mg total)  by mouth daily. 60 tablet 0   NIFEdipine (PROCARDIA XL/NIFEDICAL XL) 60 MG 24 hr tablet Take 1 tablet (60 mg total) by mouth daily. 30 tablet 0   omeprazole (PRILOSEC) 20 MG capsule Take 20 mg by mouth daily.     potassium chloride SA (KLOR-CON) 20 MEQ tablet Take 1 tablet (20 mEq total) by mouth daily for 15 days. 15 tablet 0   VITAMIN D PO Take by mouth.     No current facility-administered medications for this visit.    REVIEW OF SYSTEMS:   Constitutional: ( - ) fevers, ( - )  chills , ( - ) night sweats Eyes: ( - ) blurriness of vision, ( - ) double vision, ( - ) watery eyes Ears, nose, mouth, throat, and face: ( - ) mucositis, ( - ) sore throat Respiratory: ( - ) cough, ( - ) dyspnea, ( - ) wheezes Cardiovascular: ( - ) palpitation, ( - ) chest discomfort, ( - ) lower extremity swelling Gastrointestinal:  ( - ) nausea, ( - ) heartburn, ( - ) change in bowel habits Skin: ( - ) abnormal skin rashes Lymphatics: ( - ) new lymphadenopathy, ( - ) easy bruising Neurological: ( - ) numbness, ( - ) tingling, ( - ) new weaknesses Behavioral/Psych: ( - ) mood change, ( - )  new changes  All other systems were reviewed with the patient and are negative.  PHYSICAL EXAMINATION: ECOG PERFORMANCE STATUS: {CHL ONC ECOG [phone]}  There were no vitals filed for this visit. There were no vitals filed for this visit.  GENERAL: well appearing *** in NAD  SKIN: skin color, texture, turgor are normal, no rashes or significant lesions EYES: conjunctiva are pink and non-injected, sclera clear OROPHARYNX: no exudate, no erythema; lips, buccal mucosa, and tongue normal  NECK: supple, non-tender LYMPH:  no palpable lymphadenopathy in the cervical, axillary or supraclavicular lymph nodes.  LUNGS: clear to auscultation and percussion with normal breathing effort HEART: regular rate & rhythm and no murmurs and no lower extremity edema ABDOMEN: soft, non-tender, non-distended, normal bowel  sounds Musculoskeletal: no cyanosis of digits and no clubbing  PSYCH: alert & oriented x 3, fluent speech NEURO: no focal motor/sensory deficits  LABORATORY DATA:  I have reviewed the data as listed CBC Latest Ref Rng & Units 02/24/2021  WBC 4.0 - 10.5 K/uL 5.1  Hemoglobin 12.0 - 15.0 g/dL 11.7(L)  Hematocrit 36.0 - 46.0 % 34.3(L)  Platelets 150 - 400 K/uL 321    CMP Latest Ref Rng & Units 02/24/2021  Glucose 70 - 99 mg/dL 95  BUN 6 - 20 mg/dL 11  Creatinine 0.44 - 1.00 mg/dL 0.88  Sodium 135 - 145 mmol/L 134(L)  Potassium 3.5 - 5.1 mmol/L 3.7  Chloride 98 - 111 mmol/L 100  CO2 22 - 32 mmol/L 29  Calcium 8.9 - 10.3 mg/dL 8.9  Total Protein 6.5 - 8.1 g/dL 7.8  Total Bilirubin 0.3 - 1.2 mg/dL 0.2(L)  Alkaline Phos 38 - 126 U/L 63  AST 15 - 41 U/L 19  ALT 0 - 44 U/L 20     RADIOGRAPHIC STUDIES: I have personally reviewed the radiological images as listed and agreed with the findings in the report. CT CHEST HIGH RESOLUTION  Result Date: 04/03/2021 CLINICAL DATA:  Interstitial lung disease. EXAM: CT CHEST WITHOUT CONTRAST TECHNIQUE: Multidetector CT imaging of the chest was performed following the standard protocol without intravenous contrast. High resolution imaging of the lungs, as well as inspiratory and expiratory imaging, was performed. COMPARISON:  CT abdomen pelvis 02/24/2021. FINDINGS: Cardiovascular: Heart is enlarged.  No pericardial effusion. Mediastinum/Nodes: No pathologically enlarged mediastinal or axillary lymph nodes. Hilar regions are difficult to evaluate without IV contrast but appear grossly unremarkable. Esophagus is grossly unremarkable. Lungs/Pleura: Negative for subpleural reticulation, traction bronchiectasis/bronchiolectasis, ground-glass, architectural distortion or honeycombing. No air trapping. Calcified granulomas. Wedge resection in the right lower lobe. Scattered noncalcified pulmonary nodules measure up to 5 mm in the subpleural anterior segment right  upper lobe (5/47). No pleural fluid. Airway is unremarkable. Upper Abdomen: Visualized portions of the liver, gallbladder, adrenal glands, kidneys, spleen and pancreas are unremarkable. Sleeve gastrectomy. Small hiatal hernia. Musculoskeletal: Degenerative and postoperative changes in the spine. Scattered sclerotic lesions in the visualized osseous structures. IMPRESSION: 1. No evidence of interstitial lung disease. 2. Scattered sclerotic osseous lesions are worrisome for metastatic disease. A site of primary malignancy is not identified in the chest. 3. Noncalcified pulmonary nodules measure 5 mm or less in size and are indeterminate. Recommend continued attention on follow-up exams as metastatic disease cannot be definitively excluded. Electronically Signed   By: Lorin Picket M.D.   On: 04/03/2021 16:25   NM PET Image Initial (PI) Skull Base To Thigh  Result Date: 04/18/2021 CLINICAL DATA:  Initial treatment strategy for bone lesions on previous CT imaging. EXAM:  NUCLEAR MEDICINE PET SKULL BASE TO THIGH TECHNIQUE: Fourteen mCi F-18 FDG was injected intravenously. Full-ring PET imaging was performed from the skull base to thigh after the radiotracer. CT data was obtained and used for attenuation correction and anatomic localization. Fasting blood glucose: 106 mg/dl COMPARISON:  CT of the chest from November 20 second 2022 and CT of the abdomen and pelvis of February 24, 2021. FINDINGS: Mediastinal blood pool activity: SUV max 3.22 Liver activity: SUV max not applicable NECK: Focal area of increased metabolic activity in the LEFT neck measuring approximately 1 cm based on metabolic activity. Mild cervical nodal enlargement is demonstrated (image 32/4) 10 mm. This does not register as PET and CT images are miss registered currently. There is mild prominence of nodal tissue bilaterally. Mild prominence of nodal tissue within wall dire is a ring. Findings are nonspecific not definitely localizing to a lymph node  but not allowing for diagnosis of muscular activity based on misregistration. Misregistration artifact could also account for areas of increased "metabolic activity displaced to other anatomic areas within the neck. Mild activity anteriorly about the mandible near sites of previous surgical fixation likely related to postoperative changes. Incidental CT findings: Postoperative changes about the mandible and maxilla as discussed. CHEST: No focal increased metabolic activity in the chest. Scattered nodules in the chest may be below PET resolution showing no evidence of increased metabolic activity. Incidental CT findings: Normal caliber of the thoracic aorta. Normal caliber of central pulmonary vessels. No pericardial effusion. No consolidation. Postoperative changes of partial lung resection, wedge resection in the RIGHT lower lobe. ABDOMEN/PELVIS: No abnormal hypermetabolic activity within the liver, pancreas, adrenal glands, or spleen. No hypermetabolic lymph nodes in the abdomen or pelvis. Incidental CT findings: No acute findings relative to liver, gallbladder, pancreas, spleen, adrenal glands or kidneys. Postoperative changes of sleeve gastrectomy. No acute gastrointestinal process. Normal caliber of the abdominal aorta with smooth contour of the IVC. SKELETON: Areas of sclerotic change within the axial skeleton without destructive process showing no signs of metabolic activity above mediastinal blood pool, for instance on the posterior aspect of T4 maximum SUV in a small sclerotic focus is 2.55. Similarly at T6 and T7 maximum SUV ranges between 2.8 and 2.9. Area of greatest metabolic activity is demonstrated along the LEFT lateral aspect of the T11 vertebral body where there is sclerosis in the costovertebral margin and associated with posterior elements and there is surrounding spinal fusion. Maximum SUV in this area 4.1. Area away from spinal fusion with greatest metabolic activity appears to be in the L4  vertebral body with a maximum SUV of 3.5. Sclerotic lesion in the LEFT iliac (image 151/4) maximum SUV 1.7. Signs of bony sclerosis of the LEFT ischium maximum SUV of 2.3 (image 183/4.) Incidental CT findings: none IMPRESSION: Multifocal areas of bony sclerosis without substantial increased metabolic activity above mediastinal blood pool. Findings are indeterminate; however, based on distribution and appearance remaining suspicious for metastatic disease perhaps from an indolent process. Occasionally FDG PET can be falsely negative in the setting of sclerotic bone metastases. MRI may be helpful for more complete assessment, could consider imaging of the spine or pelvis. More pronounced sclerosis and mildly increased metabolic activity about the spine at the T11 level on the LEFT within an area of spinal fusion and adjacent to costovertebral degenerative changes, more likely related to degenerative process in this area. Misregistration artifact about the neck. Mildly enlarged lymph nodes in the neck display borderline increased metabolic activity more so on  the LEFT than the RIGHT. The metabolic activity is almost certainly due to changes of misregistration and potential reactive changes in the setting of prior surgery involving the maxilla and the mandible. Postoperative changes of partial gastrectomy. Electronically Signed   By: Zetta Bills M.D.   On: 04/18/2021 18:44    ASSESSMENT & PLAN ***  #Multiple sclerotic osseous lesions:     #Age appropriate cancer screenings: --Last colonoscopy *** --Last mammogram ***  No orders of the defined types were placed in this encounter.   All questions were answered. The patient knows to call the clinic with any problems, questions or concerns.  I have spent a total of {CHL ONC TIME VISIT - [id number]} minutes of face-to-face and non-face-to-face time, preparing to see the patient, obtaining and/or reviewing separately obtained history, performing a  medically appropriate examination, counseling and educating the patient, ordering medications/tests/procedures, referring and communicating with other health care professionals, documenting clinical information in the electronic health record, independently interpreting results and communicating results to the patient, and care coordination.   Dede Query, PA-C Department of Hematology/Oncology [place] at [facility] Phone: [phone]

## 2021-04-29 LAB — KAPPA/LAMBDA LIGHT CHAINS
Kappa free light chain: 30.2 mg/L — ABNORMAL HIGH (ref 3.3–19.4)
Kappa, lambda light chain ratio: 1.78 — ABNORMAL HIGH (ref 0.26–1.65)
Lambda free light chains: 17 mg/L (ref 5.7–26.3)

## 2021-04-29 NOTE — Progress Notes (Signed)
[place] [phone]   [phone]) [phone]  INITIAL CONSULTATION:  Patient Care Team: Pcp, No as PCP - General  CHIEF COMPLAINTS/PURPOSE OF CONSULTATION:  Multiple sclerotic osseous lesions  HISTORY OF PRESENTING ILLNESS:  Caitlin White 55 y.o. female with medical history significant for chronic back pain with sciatica, arthritis, sleep apnea, hypertension and interstitial lung disease. She recently moved from [place], [place]. She is unaccompanied for this visit.   On review of the previous records, Caitlin White presented to the emergency room on 02/24/2021 due to nausea, malaise and bilateral eye itching. CT abdomen and pelvis was obtained that showed no acute abdominal findings but multiple sclerotic osseous lesions. PET/CT scan from 04/17/2021  showed multifocal areas of bony sclerosis without substantial increased metabolic activity. There is a more pronounced sclerosis and mildly increased metabolic activity at the L79 level on the LEFT within an area of spinal fusion and adjacent to costovertebral degenerative changes, more likely related to degenerative process in this area.  On exam today, Caitlin White reports that her energy levels are fairly stable. She is able to complete her daily activities on her own. She denies any appetite or weight changes. She denies nausea, vomiting or abdominal pain. Her bowel movements are regular without diarrhea or constipation. She has chronic back pain with sciatica. She is trying to establish with a pain clinic in [place]. She denies easy bruising or signs of bleeding. She denies fevers, chills, night sweats, dyspnea, chest pain or cough. She has no other complaints. Rest of the 10 point ROS is below.   MEDICAL HISTORY:  Past Medical History:  Diagnosis Date   Arthritis    Chronic pain    Hypertension    Interstitial lung disease ([place])    Pneumonia    Sciatica     SURGICAL  HISTORY: Past Surgical History:  Procedure Laterality Date   ABDOMINAL HYSTERECTOMY     BACK SURGERY      SOCIAL HISTORY: Social History   Socioeconomic History   Marital status: Married    Spouse name: Not on file   Number of children: Not on file   Years of education: Not on file   Highest education level: Not on file  Occupational History   Not on file  Tobacco Use   Smoking status: Never   Smokeless tobacco: Never  Vaping Use   Vaping Use: Never used  Substance and Sexual Activity   Alcohol use: Never   Drug use: Never   Sexual activity: Not on file  Other Topics Concern   Not on file  Social History Narrative   Not on file   Social Determinants of Health   Financial Resource Strain: Not on file  Food Insecurity: Not on file  Transportation Needs: Not on file  Physical Activity: Not on file  Stress: Not on file  Social Connections: Not on file  Intimate Partner Violence: Not on file    FAMILY HISTORY: No family history on file.  ALLERGIES:  has No Known Allergies.  MEDICATIONS:  Current Outpatient Medications  Medication Sig Dispense Refill   levothyroxine (SYNTHROID) 175 MCG tablet Take 1 tablet (175 mcg total) by mouth daily before breakfast. 30 tablet 0   losartan (COZAAR) 100 MG tablet Take by mouth.     metoprolol succinate (TOPROL-XL) 25 MG 24 hr tablet Take 2 tablets (50 mg total) by mouth daily. 60 tablet 0   NIFEdipine (PROCARDIA XL/NIFEDICAL XL) 60 MG 24 hr  tablet Take 1 tablet (60 mg total) by mouth daily. 30 tablet 0   omeprazole (PRILOSEC) 20 MG capsule Take 20 mg by mouth daily.     potassium chloride SA (KLOR-CON) 20 MEQ tablet Take 1 tablet (20 mEq total) by mouth daily for 15 days. 15 tablet 0   VITAMIN D PO Take by mouth.     No current facility-administered medications for this visit.    REVIEW OF SYSTEMS:   Constitutional: ( - ) fevers, ( - )  chills , ( - ) night sweats Eyes: ( - ) blurriness of vision, ( - ) double vision, ( -  ) watery eyes Ears, nose, mouth, throat, and face: ( - ) mucositis, ( - ) sore throat Respiratory: ( - ) cough, ( - ) dyspnea, ( - ) wheezes Cardiovascular: ( - ) palpitation, ( - ) chest discomfort, ( - ) lower extremity swelling Gastrointestinal:  ( - ) nausea, ( - ) heartburn, ( - ) change in bowel habits Skin: ( - ) abnormal skin rashes Lymphatics: ( - ) new lymphadenopathy, ( - ) easy bruising Neurological: ( - ) numbness, ( - ) tingling, ( - ) new weaknesses Behavioral/Psych: ( - ) mood change, ( - ) new changes  All other systems were reviewed with the patient and are negative.  PHYSICAL EXAMINATION: ECOG PERFORMANCE STATUS: 1 - Symptomatic but completely ambulatory  There were no vitals filed for this visit. There were no vitals filed for this visit.  GENERAL: well appearing female in NAD  SKIN: skin color, texture, turgor are normal, no rashes or significant lesions EYES: conjunctiva are pink and non-injected, sclera clear OROPHARYNX: no exudate, no erythema; lips, buccal mucosa, and tongue normal  NECK: supple, non-tender LYMPH:  no palpable lymphadenopathy in the cervical or supraclavicular lymph nodes.  LUNGS: clear to auscultation and percussion with normal breathing effort HEART: regular rate & rhythm and no murmurs and no lower extremity edema ABDOMEN: soft, non-tender, non-distended, normal bowel sounds Musculoskeletal: no cyanosis of digits and no clubbing  PSYCH: alert & oriented x 3, fluent speech NEURO: no focal motor/sensory deficits  LABORATORY DATA:  I have reviewed the data as listed CBC Latest Ref Rng & Units 02/24/2021  WBC 4.0 - 10.5 K/uL 5.1  Hemoglobin 12.0 - 15.0 g/dL 11.7(L)  Hematocrit 36.0 - 46.0 % 34.3(L)  Platelets 150 - 400 K/uL 321    CMP Latest Ref Rng & Units 02/24/2021  Glucose 70 - 99 mg/dL 95  BUN 6 - 20 mg/dL 11  Creatinine 0.44 - 1.00 mg/dL 0.88  Sodium 135 - 145 mmol/L 134(L)  Potassium 3.5 - 5.1 mmol/L 3.7  Chloride 98 - 111  mmol/L 100  CO2 22 - 32 mmol/L 29  Calcium 8.9 - 10.3 mg/dL 8.9  Total Protein 6.5 - 8.1 g/dL 7.8  Total Bilirubin 0.3 - 1.2 mg/dL 0.2(L)  Alkaline Phos 38 - 126 U/L 63  AST 15 - 41 U/L 19  ALT 0 - 44 U/L 20     RADIOGRAPHIC STUDIES: I have personally reviewed the radiological images as listed and agreed with the findings in the report. CT CHEST HIGH RESOLUTION  Result Date: 04/03/2021 CLINICAL DATA:  Interstitial lung disease. EXAM: CT CHEST WITHOUT CONTRAST TECHNIQUE: Multidetector CT imaging of the chest was performed following the standard protocol without intravenous contrast. High resolution imaging of the lungs, as well as inspiratory and expiratory imaging, was performed. COMPARISON:  CT abdomen pelvis 02/24/2021. FINDINGS: Cardiovascular: Heart is  enlarged.  No pericardial effusion. Mediastinum/Nodes: No pathologically enlarged mediastinal or axillary lymph nodes. Hilar regions are difficult to evaluate without IV contrast but appear grossly unremarkable. Esophagus is grossly unremarkable. Lungs/Pleura: Negative for subpleural reticulation, traction bronchiectasis/bronchiolectasis, ground-glass, architectural distortion or honeycombing. No air trapping. Calcified granulomas. Wedge resection in the right lower lobe. Scattered noncalcified pulmonary nodules measure up to 5 mm in the subpleural anterior segment right upper lobe (5/47). No pleural fluid. Airway is unremarkable. Upper Abdomen: Visualized portions of the liver, gallbladder, adrenal glands, kidneys, spleen and pancreas are unremarkable. Sleeve gastrectomy. Small hiatal hernia. Musculoskeletal: Degenerative and postoperative changes in the spine. Scattered sclerotic lesions in the visualized osseous structures. IMPRESSION: 1. No evidence of interstitial lung disease. 2. Scattered sclerotic osseous lesions are worrisome for metastatic disease. A site of primary malignancy is not identified in the chest. 3. Noncalcified pulmonary  nodules measure 5 mm or less in size and are indeterminate. Recommend continued attention on follow-up exams as metastatic disease cannot be definitively excluded. Electronically Signed   By: Lorin Picket M.D.   On: 04/03/2021 16:25   NM PET Image Initial (PI) Skull Base To Thigh  Result Date: 04/18/2021 CLINICAL DATA:  Initial treatment strategy for bone lesions on previous CT imaging. EXAM: NUCLEAR MEDICINE PET SKULL BASE TO THIGH TECHNIQUE: Fourteen mCi F-18 FDG was injected intravenously. Full-ring PET imaging was performed from the skull base to thigh after the radiotracer. CT data was obtained and used for attenuation correction and anatomic localization. Fasting blood glucose: 106 mg/dl COMPARISON:  CT of the chest from November 20 second 2022 and CT of the abdomen and pelvis of February 24, 2021. FINDINGS: Mediastinal blood pool activity: SUV max 3.22 Liver activity: SUV max not applicable NECK: Focal area of increased metabolic activity in the LEFT neck measuring approximately 1 cm based on metabolic activity. Mild cervical nodal enlargement is demonstrated (image 32/4) 10 mm. This does not register as PET and CT images are miss registered currently. There is mild prominence of nodal tissue bilaterally. Mild prominence of nodal tissue within wall dire is a ring. Findings are nonspecific not definitely localizing to a lymph node but not allowing for diagnosis of muscular activity based on misregistration. Misregistration artifact could also account for areas of increased "metabolic activity displaced to other anatomic areas within the neck. Mild activity anteriorly about the mandible near sites of previous surgical fixation likely related to postoperative changes. Incidental CT findings: Postoperative changes about the mandible and maxilla as discussed. CHEST: No focal increased metabolic activity in the chest. Scattered nodules in the chest may be below PET resolution showing no evidence of increased  metabolic activity. Incidental CT findings: Normal caliber of the thoracic aorta. Normal caliber of central pulmonary vessels. No pericardial effusion. No consolidation. Postoperative changes of partial lung resection, wedge resection in the RIGHT lower lobe. ABDOMEN/PELVIS: No abnormal hypermetabolic activity within the liver, pancreas, adrenal glands, or spleen. No hypermetabolic lymph nodes in the abdomen or pelvis. Incidental CT findings: No acute findings relative to liver, gallbladder, pancreas, spleen, adrenal glands or kidneys. Postoperative changes of sleeve gastrectomy. No acute gastrointestinal process. Normal caliber of the abdominal aorta with smooth contour of the IVC. SKELETON: Areas of sclerotic change within the axial skeleton without destructive process showing no signs of metabolic activity above mediastinal blood pool, for instance on the posterior aspect of T4 maximum SUV in a small sclerotic focus is 2.55. Similarly at T6 and T7 maximum SUV ranges between 2.8 and 2.9. Area of  greatest metabolic activity is demonstrated along the LEFT lateral aspect of the T11 vertebral body where there is sclerosis in the costovertebral margin and associated with posterior elements and there is surrounding spinal fusion. Maximum SUV in this area 4.1. Area away from spinal fusion with greatest metabolic activity appears to be in the L4 vertebral body with a maximum SUV of 3.5. Sclerotic lesion in the LEFT iliac (image 151/4) maximum SUV 1.7. Signs of bony sclerosis of the LEFT ischium maximum SUV of 2.3 (image 183/4.) Incidental CT findings: none IMPRESSION: Multifocal areas of bony sclerosis without substantial increased metabolic activity above mediastinal blood pool. Findings are indeterminate; however, based on distribution and appearance remaining suspicious for metastatic disease perhaps from an indolent process. Occasionally FDG PET can be falsely negative in the setting of sclerotic bone metastases. MRI  may be helpful for more complete assessment, could consider imaging of the spine or pelvis. More pronounced sclerosis and mildly increased metabolic activity about the spine at the T11 level on the LEFT within an area of spinal fusion and adjacent to costovertebral degenerative changes, more likely related to degenerative process in this area. Misregistration artifact about the neck. Mildly enlarged lymph nodes in the neck display borderline increased metabolic activity more so on the LEFT than the RIGHT. The metabolic activity is almost certainly due to changes of misregistration and potential reactive changes in the setting of prior surgery involving the maxilla and the mandible. Postoperative changes of partial gastrectomy. Electronically Signed   By: Zetta Bills M.D.   On: 04/18/2021 18:44    ASSESSMENT & PLAN Jaskirat Zertuche is a 55 y.o. female who presents to the diagnostic clinic due to abnormal CT scan that showed multiple sclerotic osseous lesions. Patient has chronic back pain s/p back surgery and is under the care of pain clinic when she lived in [place]. Otherwise , she has no other symptoms. She adds that both her daughters have similar sclerotic bone lesions and one is currently undergoing workup with another hematologist.   Patient will proceed with serologic evaluation today and if workup is negative, we will discuss if a bone biopsy is feasible.   #Multiple sclerotic osseous lesions: --Labs today to check CBC, CMP, LDH, SPEP with IFE, sFLC --If above workup is negative, we will evaluate for bone biopsy. --RTC based on workup.  #Age appropriate cancer screenings: --Last colonoscopy and mammogram were earlier this year in [place]. We will request records for our review.   #Chronic back pain: --Patient has requested a referral to a pain clinic in [place]. Referral placed.  No orders of the defined types were placed in this encounter.   All questions were answered. The  patient knows to call the clinic with any problems, questions or concerns.  I have spent a total of 60 minutes minutes of face-to-face and non-face-to-face time, preparing to see the patient, obtaining and/or reviewing separately obtained history, performing a medically appropriate examination, counseling and educating the patient, ordering tests, referring and communicating with other health care professionals, documenting clinical information in the electronic health record, and care coordination.   Dede Query, PA-C Department of Hematology/Oncology [place] at [facility] Phone: [phone]  Patient was seen with Dr. Lorenso Courier.   I have read the above note and personally examined the patient. I agree with the assessment and plan as noted above.  Briefly Ms. Leer is a 55 year old female who presents for evaluation of multiple sclerotic lesions throughout her bones.  These are reportedly chronic in nature and had previously been evaluated.  The patient also notes that her daughter is being worked up for the same condition by Dr. Marin Olp in [facility]  She denies any bone or back pain.  No prior history of solid tumor.  Given the chronicity of these lesions I have a low suspicion of metastatic disease.  We will start with a multiple myeloma work-up and consider serial imaging to assure stability.  If the patient desired we could pursue biopsy, though I be interested to see the results of the patient's daughters biopsy prior to consideration of one in this patient.   Ledell Peoples, MD Department of Hematology/Oncology [place] at [facility] Phone: [phone] Pager: [phone] Email: [email or web address]

## 2021-04-30 DIAGNOSIS — M899 Disorder of bone, unspecified: Secondary | ICD-10-CM

## 2021-04-30 HISTORY — DX: Disorder of bone, unspecified: M89.9

## 2021-05-03 LAB — MULTIPLE MYELOMA PANEL, SERUM
Albumin SerPl Elph-Mcnc: 3.6 g/dL (ref 2.9–4.4)
Albumin/Glob SerPl: 1 (ref 0.7–1.7)
Alpha 1: 0.2 g/dL (ref 0.0–0.4)
Alpha2 Glob SerPl Elph-Mcnc: 0.7 g/dL (ref 0.4–1.0)
B-Globulin SerPl Elph-Mcnc: 1.5 g/dL — ABNORMAL HIGH (ref 0.7–1.3)
Gamma Glob SerPl Elph-Mcnc: 1.5 g/dL (ref 0.4–1.8)
Globulin, Total: 3.9 g/dL (ref 2.2–3.9)
IgA: 526 mg/dL — ABNORMAL HIGH (ref 87–352)
IgG (Immunoglobin G), Serum: 1597 mg/dL (ref 586–1602)
IgM (Immunoglobulin M), Srm: 89 mg/dL (ref 26–217)
Total Protein ELP: 7.5 g/dL (ref 6.0–8.5)

## 2021-05-08 ENCOUNTER — Telehealth: Payer: Self-pay | Admitting: Physician Assistant

## 2021-05-08 DIAGNOSIS — M899 Disorder of bone, unspecified: Secondary | ICD-10-CM

## 2021-05-09 NOTE — Telephone Encounter (Signed)
I spoke to Ms. Vangilder by phone to review the lab results form 04/26/2021. There is no evidence of monoclonal protein to suggest plasma cell disorders as cause of sclerotic lesions. Reviewed either close observation with serial imaging versus proceeding with bone biopsy. Patient has elected to proceed with bone biopsy. We will follow up once biopsy results are available for review.

## 2021-05-10 ENCOUNTER — Encounter (HOSPITAL_COMMUNITY): Payer: Self-pay

## 2021-05-10 NOTE — Progress Notes (Signed)
Caitlin White, Caitlin White Legal Sex  Female DOB  July 27, 1965 SSN  [id number] Address  [address]  [place] [address] Phone  [phone] [facility])  [phone] (Mobile)    RE: CT Biopsy Received: Today Criselda Peaches, MD  Valli Glance; P Ir Procedure Requests Approved for CT guided bx of LEFT iliac bone sclerotic lesion (PET CT image 151/229).  Unknown primary, suspect low grade process. Use the drill bone biopsy set to get good cores.     HKM        Previous Messages   ----- Message -----  From: Valli Glance  Sent: 05/08/2021   6:33 PM EST  To: Ir Procedure Requests  Subject: CT Biopsy                                       CT Biopsy       Reason: Bone lesion       History: PET and CT in Computer       Provider: Lincoln Brigham        Contact: [phone]

## 2021-05-15 DIAGNOSIS — R058 Other specified cough: Secondary | ICD-10-CM | POA: Diagnosis not present

## 2021-05-22 ENCOUNTER — Other Ambulatory Visit: Payer: Self-pay | Admitting: Radiology

## 2021-05-23 ENCOUNTER — Other Ambulatory Visit: Payer: Self-pay

## 2021-05-23 ENCOUNTER — Ambulatory Visit (HOSPITAL_COMMUNITY)
Admission: RE | Admit: 2021-05-23 | Discharge: 2021-05-23 | Disposition: A | Discharge status: Home / Self Care | Payer: Medicare HMO | Source: Ambulatory Visit | Attending: Physician Assistant | Admitting: Physician Assistant

## 2021-05-23 DIAGNOSIS — M899 Disorder of bone, unspecified: Secondary | ICD-10-CM | POA: Diagnosis not present

## 2021-05-23 DIAGNOSIS — M5430 Sciatica, unspecified side: Secondary | ICD-10-CM | POA: Diagnosis not present

## 2021-05-23 DIAGNOSIS — I10 Essential (primary) hypertension: Secondary | ICD-10-CM | POA: Insufficient documentation

## 2021-05-23 DIAGNOSIS — M1990 Unspecified osteoarthritis, unspecified site: Secondary | ICD-10-CM | POA: Insufficient documentation

## 2021-05-23 DIAGNOSIS — J849 Interstitial pulmonary disease, unspecified: Secondary | ICD-10-CM | POA: Insufficient documentation

## 2021-05-23 LAB — CBC
HCT: 32.6 % — ABNORMAL LOW (ref 36.0–46.0)
Hemoglobin: 11.2 g/dL — ABNORMAL LOW (ref 12.0–15.0)
MCH: 31.9 pg (ref 26.0–34.0)
MCHC: 34.4 g/dL (ref 30.0–36.0)
MCV: 92.9 fL (ref 80.0–100.0)
Platelets: 320 10*3/uL (ref 150–400)
RBC: 3.51 MIL/uL — ABNORMAL LOW (ref 3.87–5.11)
RDW: 13.2 % (ref 11.5–15.5)
WBC: 7.4 10*3/uL (ref 4.0–10.5)
nRBC: 0 % (ref 0.0–0.2)

## 2021-05-23 LAB — PROTIME-INR
INR: 1 (ref 0.8–1.2)
Prothrombin Time: 12.7 seconds (ref 11.4–15.2)

## 2021-05-23 MED ORDER — FENTANYL CITRATE (PF) 100 MCG/2ML IJ SOLN
INTRAMUSCULAR | Status: DC | PRN
  Administered 2021-05-23 (×3): 50 ug via INTRAVENOUS

## 2021-05-23 MED ORDER — MIDAZOLAM HCL 2 MG/2ML IJ SOLN
INTRAMUSCULAR | Status: DC | PRN
  Administered 2021-05-23 (×3): 1 mg via INTRAVENOUS

## 2021-05-23 MED ORDER — FENTANYL CITRATE (PF) 100 MCG/2ML IJ SOLN
INTRAMUSCULAR | Status: AC
  Filled 2021-05-23: qty 4

## 2021-05-23 MED ORDER — LIDOCAINE HCL 1 % IJ SOLN
INTRAMUSCULAR | Status: AC
  Filled 2021-05-23: qty 10

## 2021-05-23 MED ORDER — HYDROCODONE-ACETAMINOPHEN 5-325 MG PO TABS
1.0000 | ORAL_TABLET | ORAL | Status: DC | PRN
  Filled 2021-05-23: qty 2

## 2021-05-23 MED ORDER — SODIUM CHLORIDE 0.9 % IV SOLN: INTRAVENOUS | Status: DC

## 2021-05-23 MED ORDER — MIDAZOLAM HCL 2 MG/2ML IJ SOLN
INTRAMUSCULAR | Status: AC
  Filled 2021-05-23: qty 4

## 2021-05-23 NOTE — Procedures (Signed)
Interventional Radiology Procedure Note  Procedure: CT guided core biopsy of left iliac bone lesion  Complications: None  Estimated Blood Loss: None  Recommendations: - Bedrest x 1 hr - DC home    Signed,  Criselda Peaches, MD

## 2021-05-23 NOTE — H&P (Signed)
Chief Complaint: Caitlin White was seen in consultation today for sclerotic lesion  Referring Physician(s): Lincoln Brigham  Supervising Physician: Jacqulynn Cadet  Caitlin White Status: [facility] - Out-pt  History of Present Illness: Caitlin White is a 56 y.o. female with history of arthritis, HTN, interstitial lung disease, sciatica who presents to IR today for sclerotic bone lesion biopsy.  Caitlin White was recently assessed in the ED for nausea/vomiting. CT Abdomen Pelvis revealed several bony lesions.  PET/CT showed increased metabolic activity at K02 near prior fusion.  Case reviewed by Dr. Laurence Ferrari who has approved left iliac bone lesion biopsy.   Caitlin White presents today in her usual state of health. Caitlin White has been NPO. Caitlin White is aware of the goals of the procedure today and is agreeable to proceed.   Past Medical History:  Diagnosis Date   Arthritis    Chronic pain    Hypertension    Interstitial lung disease ([place])    Pneumonia    Sciatica     Past Surgical History:  Procedure Laterality Date   ABDOMINAL HYSTERECTOMY     BACK SURGERY      Allergies: Caitlin White has no known allergies.  Medications: Prior to Admission medications   Medication Sig Start Date End Date Taking? Authorizing Provider  gabapentin (NEURONTIN) 800 MG tablet Take 800 mg by mouth 3 (three) times daily.   Yes [provider]  levothyroxine (SYNTHROID) 200 MCG tablet  03/13/21  Yes [provider]  losartan (COZAAR) 100 MG tablet Take by mouth.   Yes [provider]  losartan-hydrochlorothiazide Konrad Penta) 100-12.5 MG tablet  03/28/21  Yes [provider]  metoprolol succinate (TOPROL-XL) 25 MG 24 hr tablet Take 2 tablets (50 mg total) by mouth daily. 02/24/21 05/23/21 Yes Sponseller, Eugene Garnet R, PA-C  NIFEdipine (PROCARDIA XL/NIFEDICAL XL) 60 MG 24 hr tablet Take 1 tablet (60 mg total) by mouth daily. 02/24/21 05/23/21 Yes Sponseller, Gypsy Balsam, PA-C  omeprazole (PRILOSEC) 20 MG  capsule Take 20 mg by mouth daily.   Yes [provider]  potassium chloride SA (KLOR-CON) 20 MEQ tablet Take 1 tablet (20 mEq total) by mouth daily for 15 days. 02/24/21 05/23/21 Yes Sponseller, Rebekah R, PA-C  VITAMIN D PO Take by mouth.   Yes [provider]  levothyroxine (SYNTHROID) 175 MCG tablet Take 1 tablet (175 mcg total) by mouth daily before breakfast. 02/24/21 03/26/21  Sponseller, Eugene Garnet R, PA-C  metoprolol succinate (TOPROL-XL) 50 MG 24 hr tablet  03/28/21   [provider]  potassium chloride (KLOR-CON M) 10 MEQ tablet Take by mouth.    [provider]     No family history on file.  Social History   Socioeconomic History   Marital status: Married    Spouse name: Not on file   Number of children: Not on file   Years of education: Not on file   Highest education level: Not on file  Occupational History   Not on file  Tobacco Use   Smoking status: Never   Smokeless tobacco: Never  Vaping Use   Vaping Use: Never used  Substance and Sexual Activity   Alcohol use: Never   Drug use: Never   Sexual activity: Not on file  Other Topics Concern   Not on file  Social History Narrative   Not on file   Social Determinants of Health   Financial Resource Strain: Not on file  Food Insecurity: Not on file  Transportation Needs: Not on file  Physical Activity: Not on file  Stress: Not on file  Social Connections: Not on file     Review of Systems: A 12 point ROS discussed and pertinent positives are indicated in the HPI above.  All other systems are negative.  Review of Systems  Constitutional:  Negative for fatigue and fever.  Respiratory:  Negative for cough and shortness of breath.   Cardiovascular:  Negative for chest pain.  Gastrointestinal:  Negative for abdominal pain, nausea and vomiting.  Musculoskeletal:  Negative for back pain.  Psychiatric/Behavioral:  Negative for behavioral problems and confusion.    Vital  Signs: BP (!) 100/53 (BP Location: Right Arm)    Pulse 93    Temp 98 F (36.7 C) (Oral)    Resp 18    Ht 6\' 1"  (1.854 m)    Wt 268 lb (121.6 kg)    BMI 35.36 kg/m   Physical Exam Vitals and nursing note reviewed.  Constitutional:      Appearance: Caitlin White is normal weight. Caitlin White is not ill-appearing.  HENT:     Mouth/Throat:     Mouth: Mucous membranes are moist.     Pharynx: Oropharynx is clear.  Cardiovascular:     Rate and Rhythm: Normal rate and regular rhythm.  Pulmonary:     Effort: Pulmonary effort is normal. No respiratory distress.     Breath sounds: Normal breath sounds.  Abdominal:     General: Abdomen is flat.     Palpations: Abdomen is soft.  Neurological:     General: No focal deficit present.     Mental Status: Caitlin White is alert and oriented to person, place, and time. Mental status is at baseline.  Psychiatric:        Mood and Affect: Mood normal.        Behavior: Behavior normal.        Thought Content: Thought content normal.        Judgment: Judgment normal.     MD Evaluation Airway: WNL Heart: WNL Abdomen: WNL Chest/ Lungs: WNL ASA  Classification: 3 Mallampati/Airway Score: Two   Imaging: No results found.  Labs:  CBC: Recent Labs    02/24/21 1801 04/26/21 1410 05/23/21 0923  WBC 5.1 4.2 7.4  HGB 11.7* 11.6* 11.2*  HCT 34.3* 33.9* 32.6*  PLT 321 314 320    COAGS: Recent Labs    05/23/21 0923  INR 1.0    BMP: Recent Labs    02/24/21 1801 04/26/21 1410  NA 134* 139  K 3.7 3.9  CL 100 105  CO2 29 25  GLUCOSE 95 106*  BUN 11 13  CALCIUM 8.9 8.9  CREATININE 0.88 0.73  GFRNONAA >60 >60    LIVER FUNCTION TESTS: Recent Labs    02/24/21 1801 04/26/21 1410  BILITOT 0.2* 0.2*  AST 19 17  ALT 20 16  ALKPHOS 63 72  PROT 7.8 7.9  ALBUMIN 3.8 3.7    TUMOR MARKERS: No results for input(s): AFPTM, CEA, CA199, CHROMGRNA in the last 8760 hours.  Assessment and Plan: Caitlin White with past medical history of arthritis, HTN presents  with complaint of sclerotic bone lesions.  IR consulted for biopsy at the request of Dede Query, [place]. Case reviewed by Dr. Laurence Ferrari who approves Caitlin White for procedure.  Caitlin White presents today in Caitlin White usual state of health.  Caitlin White has been NPO and is not currently on blood thinners.   Risks and benefits was discussed with the Caitlin White and/or Caitlin White's family including, but not limited to bleeding, infection, damage to adjacent structures  or low yield requiring additional tests.  All of the questions were answered and there is agreement to proceed.  Consent signed and in chart.  Thank you for this interesting consult.  I greatly enjoyed meeting Caitlin White and look forward to participating in Caitlin White care.  A copy of this report was sent to the requesting provider on this date.  Electronically Signed: Docia Barrier, PA 05/23/2021, 11:22 AM   I spent a total of  30 Minutes   in face to face in clinical consultation, greater than 50% of which was counseling/coordinating care for sclerotic bone lesion.

## 2021-05-23 NOTE — Sedation Documentation (Signed)
AVS reviewed with patient. She was provided with a written copy. She verbalizes understanding. Pt escorted out in W/C to care of husband.

## 2021-05-24 ENCOUNTER — Ambulatory Visit: Payer: Medicare Other | Admitting: Pulmonary Disease

## 2021-05-27 NOTE — Progress Notes (Signed)
ok 

## 2021-05-28 ENCOUNTER — Telehealth: Payer: Self-pay | Admitting: Physician Assistant

## 2021-05-28 ENCOUNTER — Telehealth: Payer: Self-pay

## 2021-05-28 LAB — SURGICAL PATHOLOGY

## 2021-05-28 NOTE — Telephone Encounter (Signed)
I called Caitlin White to review the pathology report from the iliac bone biopsy. Findings revealed sclerotic bone without any evidence of malignancy. Reviewed prior serologic workup from 04/26/2021 that did not indicate any plasma cell disorders including multiple myeloma. PET scan from 04/17/2021 did not show any other suspicious findings that require additional workup at this time.   We recommend for patient to follow up with her PCP at [facility]. We will send records with our diagnostic workup to her PCP.   Patient expressed understanding of our plan provided.

## 2021-05-28 NOTE — Telephone Encounter (Signed)
Can you fax the telephone note from today, my last visit note, PET scan results and bone biopsy results to her PCP  PCP is Karren Cobble, NP   Records faxed and confirmation received

## 2021-06-04 DIAGNOSIS — G2581 Restless legs syndrome: Secondary | ICD-10-CM | POA: Diagnosis not present

## 2021-06-04 DIAGNOSIS — E890 Postprocedural hypothyroidism: Secondary | ICD-10-CM | POA: Diagnosis not present

## 2021-06-04 DIAGNOSIS — I10 Essential (primary) hypertension: Secondary | ICD-10-CM | POA: Diagnosis not present

## 2021-06-10 DIAGNOSIS — G2581 Restless legs syndrome: Secondary | ICD-10-CM | POA: Diagnosis not present

## 2021-06-10 DIAGNOSIS — R600 Localized edema: Secondary | ICD-10-CM | POA: Diagnosis not present

## 2021-06-14 DIAGNOSIS — I10 Essential (primary) hypertension: Secondary | ICD-10-CM | POA: Diagnosis not present

## 2021-06-14 DIAGNOSIS — R600 Localized edema: Secondary | ICD-10-CM | POA: Diagnosis not present

## 2021-07-04 ENCOUNTER — Encounter: Payer: Self-pay | Admitting: Pulmonary Disease

## 2021-07-04 ENCOUNTER — Ambulatory Visit (INDEPENDENT_AMBULATORY_CARE_PROVIDER_SITE_OTHER): Payer: Medicare HMO | Admitting: Pulmonary Disease

## 2021-07-04 ENCOUNTER — Other Ambulatory Visit: Payer: Self-pay

## 2021-07-04 VITALS — BP 128/74 | HR 79 | Temp 98.1°F | Ht 72.5 in | Wt 262.4 lb

## 2021-07-04 DIAGNOSIS — M899 Disorder of bone, unspecified: Secondary | ICD-10-CM

## 2021-07-04 DIAGNOSIS — J849 Interstitial pulmonary disease, unspecified: Secondary | ICD-10-CM | POA: Diagnosis not present

## 2021-07-04 LAB — PULMONARY FUNCTION TEST
DL/VA % pred: 111 %
DL/VA: 4.5 ml/min/mmHg/L
DLCO cor % pred: 58 %
DLCO cor: 16.01 ml/min/mmHg
DLCO unc % pred: 54 %
DLCO unc: 14.81 ml/min/mmHg
FEF 25-75 Post: 4.83 L/sec
FEF 25-75 Pre: 3.25 L/sec
FEF2575-%Change-Post: 48 %
FEF2575-%Pred-Post: 164 %
FEF2575-%Pred-Pre: 110 %
FEV1-%Change-Post: 4 %
FEV1-%Pred-Post: 96 %
FEV1-%Pred-Pre: 92 %
FEV1-Post: 2.95 L
FEV1-Pre: 2.81 L
FEV1FVC-%Change-Post: 7 %
FEV1FVC-%Pred-Pre: 105 %
FEV6-%Change-Post: -2 %
FEV6-%Pred-Post: 86 %
FEV6-%Pred-Pre: 88 %
FEV6-Post: 3.22 L
FEV6-Pre: 3.29 L
FEV6FVC-%Pred-Post: 102 %
FEV6FVC-%Pred-Pre: 102 %
FVC-%Change-Post: -2 %
FVC-%Pred-Post: 84 %
FVC-%Pred-Pre: 86 %
FVC-Post: 3.22 L
FVC-Pre: 3.29 L
Post FEV1/FVC ratio: 92 %
Post FEV6/FVC ratio: 100 %
Pre FEV1/FVC ratio: 85 %
Pre FEV6/FVC Ratio: 100 %
RV % pred: 103 %
RV: 2.36 L
TLC % pred: 85 %
TLC: 5.4 L

## 2021-07-04 NOTE — Progress Notes (Signed)
Caitlin White    [id number]    08/24/1965  Primary Care Physician:Stamey, Girtha Rm, FNP  Referring Physician: Marshell Garfinkel, [place],  [place] [address]  Chief complaint: Follow-up for interstitial lung disease  HPI: 56 year old with history of unspecified interstitial lung disease, sleep apnea She has moved here from [place] and is here to establish care Previously followed by Dr. Nyra Jabs in [place], [place]  As per the patient she had severe bilateral pneumonia in 2013 requiring hospitalizations and fluid drainage.  She developed interstitial lung disease after that but does not recall the specific details.  She has been followed by pulmonary since then for monitoring and is not on any specific treatment History also notable for sleep apnea and has been on CPAP for the past 3 years  At present denies any dyspnea, fevers, chills  Pets: No pets Occupation: Disabled since 2021.  Used to work as a Engineer, manufacturing Exposures: No mold, hot tub, Jacuzzi.  No feather pillows or comforters Smoking history: Never smoker Travel history: Originally from [place].  Moved to [place] in 2022 Relevant family history: No significant family history of lung disease  Interim history: Doing well without issue.  No new complaints Here for review of CT scan  She has been evaluated by oncology for sclerotic lesions of the bone with PET scan and bone biopsy which did not reveal any malignancy.  ----------------------------------- Received clinic notes from patient's pulmonologist Dr. Modena Nunnery dated 07/18/2020.  Review of anxiety  Patient has history of interstitial lung disease, nonspecific ILD.   Open lung biopsy right lower lobe on 06/10/2012, she completed a long course of steroids with complete resolution of infiltrates  Core IR biopsy of right middle lobe nodule on 08/17/2015-patchy chronic interstitial inflammation and alveolar  histiocytes, negative for granulomata, vasculitis or neoplasm.  She completed 2 months of prednisone from April to May 2017  She follows Dr. Chriss Czar for evaluation of numeric hyperintense of lesions affecting vertebral bodies which is stable for 4 years.  History of total thyroidectomy for thyroid nodules in November 2020  Has history of OSA on CPAP  Outpatient Encounter Medications as of 07/04/2021  Medication Sig   gabapentin (NEURONTIN) 800 MG tablet Take 800 mg by mouth 3 (three) times daily.   levothyroxine (SYNTHROID) 175 MCG tablet Take 1 tablet (175 mcg total) by mouth daily before breakfast.   levothyroxine (SYNTHROID) 200 MCG tablet    losartan (COZAAR) 100 MG tablet Take by mouth.   losartan-hydrochlorothiazide (HYZAAR) 100-12.5 MG tablet    metoprolol succinate (TOPROL-XL) 50 MG 24 hr tablet    omeprazole (PRILOSEC) 20 MG capsule Take 20 mg by mouth daily.   potassium chloride (KLOR-CON M) 10 MEQ tablet Take by mouth.   potassium chloride SA (KLOR-CON) 20 MEQ tablet Take 1 tablet (20 mEq total) by mouth daily for 15 days.   VITAMIN D PO Take by mouth.   metoprolol succinate (TOPROL-XL) 25 MG 24 hr tablet Take 2 tablets (50 mg total) by mouth daily.   NIFEdipine (PROCARDIA XL/NIFEDICAL XL) 60 MG 24 hr tablet Take 1 tablet (60 mg total) by mouth daily.   No facility-administered encounter medications on file as of 07/04/2021.    Physical Exam: Blood pressure 128/74, pulse 79, temperature 98.1 F (36.7 C), temperature source Oral, height 6' 0.5" (1.842 m), weight 262 lb 6.4 oz (119 kg), SpO2 99 %. Gen:  No acute distress HEENT:  EOMI, sclera anicteric Neck:     No masses; no thyromegaly Lungs:    Clear to auscultation bilaterally; normal respiratory effort CV:         Regular rate and rhythm; no murmurs Abd:      + bowel sounds; soft, non-tender; no palpable masses, no distension Ext:    No edema; adequate peripheral perfusion Skin:      Warm and dry; no  rash Neuro: alert and oriented x 3 Psych: normal mood and affect   Data Reviewed: Imaging: CT abdomen pelvis 02/24/2021-visualized lung bases are clear.  Multiple pulmonary nodules measuring up to 3 mm.    CT high-resolution 04/03/2021-no interstitial lung disease, scattered sclerotic osseous lesions, 5 mm lung nodule  PET scan 04/17/2021-multifocal areas of bony sclerosis without metabolic activity I have reviewed the images personally.  PFTs: 07/04/2021 FVC 3.22 [84%], FEV1 2.95 [9 6%], F/F 92, TLC 5.40 [85%], DLCO 14.81 [54%] Isolated diffusion defect  Labs:  Assessment:  Evaluation of interstitial lung disease She has unspecified interstitial lung disease after hospitalization for pneumonia in 2013 Which was treated with a long course of steroids.  Follow-up CT scan does not show any residual ILD  PFTs do show an isolated diffusion defect.  We will get an echocardiogram to evaluate presence of pulmonary hypertension  Sclerotic bone lesions. This has been a longstanding issue per records from [place].  Work-up here did not show any malignancy  OSA On CPAP.  She will get her SD card for download Obtain records of sleep study  Plan/Recommendations: Try to obtain CPAP download Echocardiogram  Marshell Garfinkel MD [facility] 07/04/2021, 11:21 AM  CC: Marshell Garfinkel, MD

## 2021-07-04 NOTE — Patient Instructions (Addendum)
I have reviewed his CT scan which does not show interstitial lung disease which is good news Your lung function test does show reduction in capacity to exchange oxygen.  We will get ultrasound of the heart to further evaluate this  Please get your SD card so that we can get a download from CPAP Return to clinic in 6 months

## 2021-07-04 NOTE — Progress Notes (Signed)
PFT done today. 

## 2021-07-05 ENCOUNTER — Encounter: Payer: Self-pay | Admitting: Pulmonary Disease

## 2021-07-05 DIAGNOSIS — M159 Polyosteoarthritis, unspecified: Secondary | ICD-10-CM | POA: Diagnosis not present

## 2021-07-10 ENCOUNTER — Encounter (HOSPITAL_COMMUNITY): Payer: Self-pay | Admitting: Pulmonary Disease

## 2021-07-24 ENCOUNTER — Telehealth (HOSPITAL_COMMUNITY): Payer: Self-pay | Admitting: Pulmonary Disease

## 2021-07-24 NOTE — Telephone Encounter (Signed)
Just an FYI. ?We have made several attempts to contact this patient including sending a letter to schedule or reschedule their echocardiogram. We will be removing the patient from the echo WQ. ? ?07/10/21 MAILED LETTER [facility]  ?07/10/21 [facility] to schedule @ 11:29/[facility]  ?07/08/21 [facility] to schedule @ 1:04/[facility]  ?07/05/21 [facility] to schedule @ 1:39/[facility]  ? ? ? ? ?Thank you  ?

## 2021-08-28 DIAGNOSIS — I10 Essential (primary) hypertension: Secondary | ICD-10-CM | POA: Diagnosis not present

## 2021-08-28 DIAGNOSIS — D509 Iron deficiency anemia, unspecified: Secondary | ICD-10-CM | POA: Diagnosis not present

## 2021-08-28 DIAGNOSIS — M159 Polyosteoarthritis, unspecified: Secondary | ICD-10-CM | POA: Diagnosis not present

## 2021-09-04 DIAGNOSIS — M19079 Primary osteoarthritis, unspecified ankle and foot: Secondary | ICD-10-CM | POA: Diagnosis not present

## 2021-09-04 DIAGNOSIS — G2581 Restless legs syndrome: Secondary | ICD-10-CM | POA: Diagnosis not present

## 2021-09-17 DIAGNOSIS — M9261 Juvenile osteochondrosis of tarsus, right ankle: Secondary | ICD-10-CM | POA: Diagnosis not present

## 2021-10-01 ENCOUNTER — Encounter: Payer: Self-pay | Admitting: Podiatry

## 2021-10-01 ENCOUNTER — Ambulatory Visit (INDEPENDENT_AMBULATORY_CARE_PROVIDER_SITE_OTHER): Payer: Medicare HMO | Admitting: Podiatry

## 2021-10-01 ENCOUNTER — Ambulatory Visit (INDEPENDENT_AMBULATORY_CARE_PROVIDER_SITE_OTHER): Payer: Medicare HMO

## 2021-10-01 DIAGNOSIS — M21961 Unspecified acquired deformity of right lower leg: Secondary | ICD-10-CM | POA: Diagnosis not present

## 2021-10-01 DIAGNOSIS — G6289 Other specified polyneuropathies: Secondary | ICD-10-CM | POA: Diagnosis not present

## 2021-10-01 DIAGNOSIS — M21861 Other specified acquired deformities of right lower leg: Secondary | ICD-10-CM

## 2021-10-01 DIAGNOSIS — M9261 Juvenile osteochondrosis of tarsus, right ankle: Secondary | ICD-10-CM

## 2021-10-01 DIAGNOSIS — M7661 Achilles tendinitis, right leg: Secondary | ICD-10-CM

## 2021-10-01 MED ORDER — METHYLPREDNISOLONE 4 MG PO TBPK: ORAL_TABLET | ORAL | 0 refills | Status: DC

## 2021-10-01 NOTE — Progress Notes (Signed)
  Subjective:  Patient ID: Caitlin White, female    DOB: 02/07/1966,  MRN: [id number]  Chief Complaint  Patient presents with   Foot Problem    Haglands deformity of right heel     56 y.o. female presents with the above complaint. History confirmed with patient. Its been bothering her for about a month sometimes it so bad she cannot walk.  She cannot wear shoes that rub on it at all.  Objective:  Physical Exam: warm, good capillary refill, no trophic changes or ulcerative lesions, normal DP and PT pulses, and normal sensory exam. Left Foot: normal exam, no swelling, tenderness, instability; ligaments intact, full range of motion of all ankle/foot joints Right Foot: tenderness at Achilles tendon insertion and gastrocnemius equinus is noted with a positive silverskiold test  No images are attached to the encounter.  Radiographs: Multiple views x-ray of the right foot: no fracture, dislocation, swelling or degenerative changes noted and posterior calcaneal spur Assessment:   1. Haglund's deformity, right   2. Other polyneuropathy   3. Achilles tendinitis, right leg   4. Gastrocnemius equinus of right lower extremity      Plan:  Patient was evaluated and treated and all questions answered.  Discussed the etiology and treatment options for Achilles tendinitis including stretching, formal physical therapy with an eccentric exercises therapy plan, supportive shoegears such as a running shoe or sneaker, heel lifts, topical and oral medications.  We also discussed that I do not routinely perform injections in this area because of the risk of an increased damage or rupture of the tendon.  We also discussed the role of surgical treatment of this for patients who do not improve after exhausting non-surgical treatment options.  -XR reviewed with patient -Educated on stretching and icing of the affected limb. -Referral placed to physical therapy at [facility] for Medrol 6-day taper.  Advised on risks, benefits, and alternatives of the medication.  She cannot tolerate NSAIDs due to previous gastric sleeve -CAM boot was dispensed today to support and immobilize the Achilles tendon to allow to rest.  Wear this until next visit, begin physical therapy in 3 weeks after rest -Recommended diclofenac gel 4 times daily which she currently has  Return in about 6 weeks (around 11/12/2021) for re-check Achilles tendon.

## 2021-10-01 NOTE — Patient Instructions (Signed)

## 2021-10-15 ENCOUNTER — Encounter (HOSPITAL_BASED_OUTPATIENT_CLINIC_OR_DEPARTMENT_OTHER): Payer: Self-pay | Admitting: Emergency Medicine

## 2021-10-15 ENCOUNTER — Other Ambulatory Visit: Payer: Self-pay

## 2021-10-15 ENCOUNTER — Emergency Department (HOSPITAL_BASED_OUTPATIENT_CLINIC_OR_DEPARTMENT_OTHER)
Admission: EM | Admit: 2021-10-15 | Discharge: 2021-10-15 | Disposition: A | Discharge status: Home / Self Care | Payer: Medicare HMO | Attending: Emergency Medicine | Admitting: Emergency Medicine

## 2021-10-15 DIAGNOSIS — R42 Dizziness and giddiness: Secondary | ICD-10-CM | POA: Diagnosis not present

## 2021-10-15 DIAGNOSIS — R5383 Other fatigue: Secondary | ICD-10-CM | POA: Insufficient documentation

## 2021-10-15 DIAGNOSIS — R7309 Other abnormal glucose: Secondary | ICD-10-CM | POA: Diagnosis not present

## 2021-10-15 DIAGNOSIS — R55 Syncope and collapse: Secondary | ICD-10-CM | POA: Insufficient documentation

## 2021-10-15 DIAGNOSIS — W19XXXA Unspecified fall, initial encounter: Secondary | ICD-10-CM | POA: Diagnosis not present

## 2021-10-15 DIAGNOSIS — I10 Essential (primary) hypertension: Secondary | ICD-10-CM | POA: Insufficient documentation

## 2021-10-15 LAB — BASIC METABOLIC PANEL
Anion gap: 7 (ref 5–15)
BUN: 11 mg/dL (ref 6–20)
CO2: 28 mmol/L (ref 22–32)
Calcium: 9 mg/dL (ref 8.9–10.3)
Chloride: 102 mmol/L (ref 98–111)
Creatinine, Ser: 0.73 mg/dL (ref 0.44–1.00)
GFR, Estimated: >60 mL/min (ref >60)
Glucose, Bld: 159 mg/dL — ABNORMAL HIGH (ref 70–99)
Potassium: 3.8 mmol/L (ref 3.5–5.1)
Sodium: 137 mmol/L (ref 135–145)

## 2021-10-15 LAB — CBC
HCT: 35.7 % — ABNORMAL LOW (ref 36.0–46.0)
Hemoglobin: 12.3 g/dL (ref 12.0–15.0)
MCH: 31.2 pg (ref 26.0–34.0)
MCHC: 34.5 g/dL (ref 30.0–36.0)
MCV: 90.6 fL (ref 80.0–100.0)
Platelets: 281 10*3/uL (ref 150–400)
RBC: 3.94 MIL/uL (ref 3.87–5.11)
RDW: 13 % (ref 11.5–15.5)
WBC: 6 10*3/uL (ref 4.0–10.5)
nRBC: 0 % (ref 0.0–0.2)

## 2021-10-15 LAB — URINALYSIS, ROUTINE W REFLEX MICROSCOPIC
Bilirubin Urine: NEGATIVE
Glucose, UA: NEGATIVE mg/dL
Hgb urine dipstick: NEGATIVE
Ketones, ur: NEGATIVE mg/dL
Leukocytes,Ua: NEGATIVE
Nitrite: NEGATIVE
Protein, ur: NEGATIVE mg/dL
Specific Gravity, Urine: >=1.03 (ref 1.005–1.030)
pH: 5.5 (ref 5.0–8.0)

## 2021-10-15 LAB — TROPONIN I (HIGH SENSITIVITY): Troponin I (High Sensitivity): 4 ng/L (ref <18)

## 2021-10-15 MED ORDER — SODIUM CHLORIDE 0.9 % IV BOLUS
500.0000 mL | Freq: Once | INTRAVENOUS | Status: AC
  Administered 2021-10-15: 500 mL via INTRAVENOUS

## 2021-10-15 MED ORDER — ACETAMINOPHEN 500 MG PO TABS
1000.0000 mg | ORAL_TABLET | Freq: Four times a day (QID) | ORAL | Status: DC | PRN
  Administered 2021-10-15: 1000 mg via ORAL
  Filled 2021-10-15: qty 2

## 2021-10-15 NOTE — Discharge Instructions (Signed)

## 2021-10-15 NOTE — ED Provider Notes (Signed)
Emergency Department Provider Note   I have reviewed the triage vital signs and the nursing notes.   HISTORY  Chief Complaint Fall and Near Syncope   HPI Caitlin White is a 56 y.o. female presents to the ED with fatigue and syncope 3 days prior. She was at home and slept late due to some medication she was taking. She awoke and felt sleepy so returned home. She was feeling fatigued and reports a brief syncope episode at home. She felt suddenly lightheaded and fell landing on the left hip and arm. Denies head trauma or pain. Has some lingering left arm/hip soreness but is using the extremities without difficulty. Continues to have some fatigue. No vomiting. No CP, palpitations, or SOB. No abdominal pain or GI symptoms.    Past Medical History:  Diagnosis Date   Arthritis    Chronic pain    Hypertension    Interstitial lung disease ([place])    Pneumonia    Sciatica     Review of Systems  Constitutional: No fever/chills. Positive fatigue.  Eyes: No visual changes. ENT: No sore throat. Cardiovascular: Denies chest pain. Positive syncope.  Respiratory: Denies shortness of breath. Gastrointestinal: No abdominal pain.  No nausea, no vomiting.  No diarrhea.  No constipation. Genitourinary: Negative for dysuria. Musculoskeletal: Negative for back pain. Positive left arm and hip soreness.  Skin: Negative for rash. Neurological: Negative for headaches, focal weakness or numbness.   ____________________________________________   PHYSICAL EXAM:  VITAL SIGNS: ED Triage Vitals [10/15/21 1924]  Enc Vitals Group     BP (!) 153/89     Pulse Rate 99     Resp 18     Temp 98.7 F (37.1 C)     Temp Source Oral     SpO2 100 %   Constitutional: Alert and oriented. Well appearing and in no acute distress. Eyes: Conjunctivae are normal.  Head: Atraumatic. Nose: No congestion/rhinnorhea. Mouth/Throat: Mucous membranes are moist.   Neck: No stridor.  No cervical spine tenderness to  palpation. Cardiovascular: Normal rate, regular rhythm. Good peripheral circulation. Grossly normal heart sounds.   Respiratory: Normal respiratory effort.  No retractions. Lungs CTAB. Gastrointestinal: Soft and nontender. No distention.  Musculoskeletal: No lower extremity tenderness nor edema. No gross deformities of extremities. Reported soreness of the left upper and lower extremities but normal ROM. No point/bony tenderness or deformity/bruising.  Neurologic:  Normal speech and language. No gross focal neurologic deficits are appreciated. 5/5 strength in the bilateral upper and lower extremities with preserved sensation. No facial asymmetry.  Skin:  Skin is warm, dry and intact. No rash noted.  ____________________________________________   LABS (all labs ordered are listed, but only abnormal results are displayed)  Labs Reviewed  BASIC METABOLIC PANEL - Abnormal; Notable for the following components:      Result Value   Glucose, Bld 159 (*)    All other components within normal limits  CBC - Abnormal; Notable for the following components:   HCT 35.7 (*)    All other components within normal limits  URINALYSIS, ROUTINE W REFLEX MICROSCOPIC  TROPONIN I (HIGH SENSITIVITY)  TROPONIN I (HIGH SENSITIVITY)   ____________________________________________  EKG   EKG Interpretation  Date/Time:  Tuesday October 15 2021 19:28:28 EDT Ventricular Rate:  94 PR Interval:  160 QRS Duration: 80 QT Interval:  354 QTC Calculation: 442 R Axis:   21 Text Interpretation: Normal sinus rhythm Cannot rule out Anterior infarct , age undetermined T wave abnormality, consider inferolateral ischemia Abnormal  ECG No previous ECGs available Confirmed by Nanda Quinton [phone]) on 10/15/2021 7:35:36 PM       ____________________________________________   PROCEDURES  Procedure(s) performed:   Procedures  None ____________________________________________   INITIAL IMPRESSION / ASSESSMENT AND PLAN /  ED COURSE  Pertinent labs & imaging results that were available during my care of the patient were reviewed by me and considered in my medical decision making (see chart for details).   This patient is Presenting for Evaluation of syncope, which does require a range of treatment options, and is a complaint that involves a high risk of morbidity and mortality.  The Differential Diagnoses include cardiogenic syncope, vasovagal syncope, seizure, dehydration.  Critical Interventions-    Medications  sodium chloride 0.9 % bolus 500 mL (0 mLs Intravenous Stopped 10/15/21 2251)    Reassessment after intervention: Patient feeling improved.    I decided to review pertinent External Data, and in summary no recent ED visits for similar.   Clinical Laboratory Tests Ordered, included normal troponin. No AKI or electrolyte disturbance. Mild elevated CBG. CBC without anemia or leukocytosis. UA without infection.   Radiologic Tests: Considered imaging of LUE and LLE but no bony tenderness and normal ROM of my exam. Defer emergent imaging for now.   Cardiac Monitor Tracing which shows NSR w/o ectopy.    Social Determinants of Health Risk no smoking history.   Medical Decision Making: Summary:  Patient presents to the ED several days after syncope. There seems to be a drowsy component related to home medication. Patient reported taking Wellbutryn and is asking about an Alprazolam Rx refill here in the ED. Referred her back to PCP for refills. No clear evidence of cardiogenic syncope or stigmata of seizure. No head trauma or HA/neuro deficit to prompt head imaging.   Reevaluation with update and discussion with patient. Discussed results with patient. Documented lower BP in chart but on my reassessment prior to d/c BP in normal range. Patient advised to call PCP in the AM for arrange follow up ASAP for re-evaluation.   Considered admission but labs and imaging are reassuring. Patient feeling improved.    Disposition: discharge  ____________________________________________  FINAL CLINICAL IMPRESSION(S) / ED DIAGNOSES  Final diagnoses:  Syncope and collapse     Note:  This document was prepared using Dragon voice recognition software and may include unintentional dictation errors.  Nanda Quinton, MD, [facility] Emergency [facility]    Zorina Mallin, [facility], MD 10/16/21 [phone]

## 2021-10-15 NOTE — ED Triage Notes (Signed)
Pt arrives pov steady gait. Reports near syncopal episode on Sunday. Pt states she felt dizzy, then fell and landed on her left side. Endorses soreness to left side/ Also reports generalized malaise and decreased PO intake.

## 2021-10-22 DIAGNOSIS — R519 Headache, unspecified: Secondary | ICD-10-CM | POA: Diagnosis not present

## 2021-10-22 DIAGNOSIS — M542 Cervicalgia: Secondary | ICD-10-CM | POA: Diagnosis not present

## 2021-11-19 ENCOUNTER — Ambulatory Visit (INDEPENDENT_AMBULATORY_CARE_PROVIDER_SITE_OTHER): Payer: Self-pay | Admitting: Podiatry

## 2021-11-19 DIAGNOSIS — Z91199 Patient's noncompliance with other medical treatment and regimen due to unspecified reason: Secondary | ICD-10-CM

## 2021-11-20 NOTE — Progress Notes (Signed)
Patient was no-show for appointment today 

## 2021-11-26 DIAGNOSIS — I10 Essential (primary) hypertension: Secondary | ICD-10-CM | POA: Diagnosis not present

## 2021-11-26 DIAGNOSIS — G2581 Restless legs syndrome: Secondary | ICD-10-CM | POA: Diagnosis not present

## 2021-11-26 DIAGNOSIS — K219 Gastro-esophageal reflux disease without esophagitis: Secondary | ICD-10-CM | POA: Diagnosis not present

## 2022-01-24 DIAGNOSIS — M654 Radial styloid tenosynovitis [de Quervain]: Secondary | ICD-10-CM | POA: Diagnosis not present

## 2022-01-30 ENCOUNTER — Ambulatory Visit: Payer: Medicare HMO | Admitting: Neurology

## 2022-02-05 ENCOUNTER — Encounter: Payer: Self-pay | Admitting: Neurology

## 2022-02-05 ENCOUNTER — Ambulatory Visit: Payer: Medicare HMO | Admitting: Neurology

## 2022-02-05 VITALS — BP 177/87 | HR 79 | Ht 73.0 in | Wt 261.5 lb

## 2022-02-05 DIAGNOSIS — M961 Postlaminectomy syndrome, not elsewhere classified: Secondary | ICD-10-CM

## 2022-02-05 DIAGNOSIS — R0683 Snoring: Secondary | ICD-10-CM | POA: Diagnosis not present

## 2022-02-05 DIAGNOSIS — G4719 Other hypersomnia: Secondary | ICD-10-CM

## 2022-02-05 DIAGNOSIS — G8929 Other chronic pain: Secondary | ICD-10-CM

## 2022-02-05 DIAGNOSIS — G4701 Insomnia due to medical condition: Secondary | ICD-10-CM

## 2022-02-05 DIAGNOSIS — Z9889 Other specified postprocedural states: Secondary | ICD-10-CM

## 2022-02-05 DIAGNOSIS — Z8669 Personal history of other diseases of the nervous system and sense organs: Secondary | ICD-10-CM

## 2022-02-05 DIAGNOSIS — Z72821 Inadequate sleep hygiene: Secondary | ICD-10-CM

## 2022-02-05 DIAGNOSIS — E890 Postprocedural hypothyroidism: Secondary | ICD-10-CM

## 2022-02-05 DIAGNOSIS — Z9089 Acquired absence of other organs: Secondary | ICD-10-CM

## 2022-02-05 DIAGNOSIS — Z91199 Patient's noncompliance with other medical treatment and regimen due to unspecified reason: Secondary | ICD-10-CM | POA: Diagnosis not present

## 2022-02-05 HISTORY — DX: Personal history of other diseases of the nervous system and sense organs: Z86.69

## 2022-02-05 HISTORY — DX: Insomnia due to medical condition: G47.01

## 2022-02-05 HISTORY — DX: Postlaminectomy syndrome, not elsewhere classified: M96.1

## 2022-02-05 HISTORY — DX: Other hypersomnia: G47.19

## 2022-02-05 HISTORY — DX: Other chronic pain: G89.29

## 2022-02-05 HISTORY — DX: Acquired absence of other organs: Z90.89

## 2022-02-05 HISTORY — DX: Postprocedural hypothyroidism: E89.0

## 2022-02-05 MED ORDER — CYCLOBENZAPRINE HCL 10 MG PO TABS: 10.0000 mg | ORAL_TABLET | Freq: Every day | ORAL | 0 refills | Status: DC

## 2022-02-05 NOTE — Patient Instructions (Addendum)
Quality Sleep Information, Adult Quality sleep is important for your mental and physical health. It also improves your quality of life. Quality sleep means you: Are asleep for most of the time you are in bed. Fall asleep within 30 minutes. Wake up no more than once a night. Are awake for no longer than 20 minutes if you do wake up during the night. Most adults need 7-8 hours of quality sleep each night. How can poor sleep affect me? If you do not get enough quality sleep, you may have: Mood swings. Daytime sleepiness. Decreased alertness, reaction time, and concentration. Sleep disorders, such as insomnia and sleep apnea. Difficulty with: Solving problems. Coping with stress. Paying attention. These issues may affect your performance and productivity at work, school, and home. Lack of sleep may also put you at higher risk for accidents, suicide, and risky behaviors. If you do not get quality sleep, you may also be at higher risk for several health problems, including: Infections. Type 2 diabetes. Heart disease. High blood pressure. Obesity. Worsening of long-term conditions, like arthritis, kidney disease, depression, Parkinson's disease, and epilepsy. What actions can I take to get more quality sleep? Sleep schedule and routine Stick to a sleep schedule. Go to sleep and wake up at about the same time each day. Do not try to sleep less on weekdays and make up for lost sleep on weekends. This does not work. Limit naps during the day to 30 minutes or less. Do not take naps in the late afternoon. Make time to relax before bed. Reading, listening to music, or taking a hot bath promotes quality sleep. Make your bedroom a place that promotes quality sleep. Keep your bedroom dark, quiet, and at a comfortable room temperature. Make sure your bed is comfortable. Avoid using electronic devices that give off bright blue light for 30 minutes before bedtime. Your brain perceives bright blue  light as sunlight. This includes television, phones, and computers. If you are lying awake in bed for longer than 20 minutes, get up and do a relaxing activity until you feel sleepy. Lifestyle     Try to get at least 30 minutes of exercise on most days. Do not exercise 2-3 hours before going to bed. Do not use any products that contain nicotine or tobacco. These products include cigarettes, chewing tobacco, and vaping devices, such as e-cigarettes. If you need help quitting, ask your health care provider. Do not drink caffeinated beverages for at least 8 hours before going to bed. Coffee, tea, and some sodas contain caffeine. Do not drink alcohol or eat large meals close to bedtime. Try to get at least 30 minutes of sunlight every day. Morning sunlight is best. Medical concerns Work with your health care provider to treat medical conditions that may affect sleeping, such as: Nasal obstruction. Snoring. Sleep apnea and other sleep disorders. Talk to your health care provider if you think any of your prescription medicines may cause you to have difficulty falling or staying asleep. If you have sleep problems, talk with a sleep consultant. If you think you have a sleep disorder, talk with your health care provider about getting evaluated by a specialist. Where to find more information Sleep Foundation: sleepfoundation.org American Academy of Sleep Medicine: aasm.org Centers for Disease Control and Prevention (CDC): StoreMirror.com.cy Contact a health care provider if: You have trouble getting to sleep or staying asleep. You often wake up very early in the morning and cannot get back to sleep. You have daytime  sleepiness. You have daytime sleep attacks of suddenly falling asleep and sudden muscle weakness (narcolepsy). You have a tingling sensation in your legs with a strong urge to move your legs (restless legs syndrome). You stop breathing briefly during sleep (sleep apnea). You think you have a  sleep disorder or are taking a medicine that is affecting your quality of sleep. Summary Most adults need 7-8 hours of quality sleep each night. Getting enough quality sleep is important for your mental and physical health. Make your bedroom a place that promotes quality sleep, and avoid things that may cause you to have poor sleep, such as alcohol, caffeine, smoking, or large meals. Talk to your health care provider if you have trouble falling asleep or staying asleep. This information is not intended to replace advice given to you by your health care provider. Make sure you discuss any questions you have with your health care provider. Document Revised: 08/21/2021 Document Reviewed: 08/21/2021 Elsevier Patient Education  [place]. Chronic Pain, Adult Chronic pain is a type of pain that lasts or keeps coming back for at least 3-6 months. You may have headaches, pain in the abdomen, or pain in other areas of the body. Chronic pain may be related to an illness, such as fibromyalgia or complex regional pain syndrome. Chronic pain may also be related to an injury or a health condition. Sometimes, the cause of chronic pain is not known. Chronic pain can make it hard for you to do daily activities. If not treated, chronic pain can lead to anxiety and depression. Treatment depends on the cause and severity of your pain. You may need to work with a pain specialist to come up with a treatment plan. The plan may include medicine, counseling, and physical therapy. Many people benefit from a combination of two or more types of treatment to control their pain. Follow these instructions at home: Medicines Take over-the-counter and prescription medicines only as told by your health care provider. Ask your health care provider if the medicine prescribed to you: Requires you to avoid driving or using machinery. Can cause constipation. You may need to take these actions to prevent or treat  constipation: Drink enough fluid to keep your urine pale yellow. Take over-the-counter or prescription medicines. Eat foods that are high in fiber, such as beans, whole grains, and fresh fruits and vegetables. Limit foods that are high in fat and processed sugars, such as fried or sweet foods. Treatment plan Follow your treatment plan as told by your health care provider. This may include: Gentle, regular exercise. Eating a healthy diet that includes foods such as vegetables, fruits, fish, and lean meats. Cognitive or behavioral therapy that changes the way you think or act in response to the pain. This may help improve how you feel. Working with a physical therapist. Meditation, yoga, acupuncture, or massage therapy. Aroma, color, light, or sound therapy. Local electrical stimulation. The electrical pulses help to relieve pain by temporarily stopping the nerve impulses that cause you to feel pain. Injections. These deliver numbing or pain-relieving medicines into the spine or the area of pain.  Lifestyle  Ask your health care provider whether you should keep a pain diary. Your health care provider will tell you what information to write in the diary. This may include when you have pain, what the pain feels like, and how medicines and other behaviors or treatments help to reduce the pain. Consider talking with a mental health care provider about how to manage chronic  pain. Consider joining a chronic pain support group. Try to control or lower your stress levels. Talk with your health care provider about ways to do this. General instructions Learn as much as you can about how to manage your chronic pain. Ask your health care provider if an intensive pain rehabilitation program or a chronic pain specialist would be helpful. Check your pain level as told by your health care provider. Ask your health care provider if you should use a pain scale. It is up to you to get the results of any tests  that were done. Ask your health care provider, or the department that is doing the tests, when your results will be ready. Keep all follow-up visits as told by your health care provider. This is important. Contact a health care provider if: Your pain gets worse, or you have new pain. You have trouble sleeping. You have trouble doing your normal activities. Your pain is not controlled with treatment. You have side effects from pain medicine. You feel weak. You notice any other changes that show that your condition is getting worse. Get help right away if: You lose feeling or have numbness in your body. You lose control of bowel or bladder function. Your pain suddenly gets much worse. You develop shaking or chills. You develop confusion. You develop chest pain. You have trouble breathing or shortness of breath. You pass out. You have thoughts about hurting yourself or others. If you ever feel like you may hurt yourself or others, or have thoughts about taking your own life, get help right away. Go to your nearest emergency department or: Call your local emergency services (911 in the [place] Call a suicide crisis helpline, such as the [facility] at 661-781-8336 or 988 in the [place]. This is open 24 hours a day in the [place] Text the Crisis Text Line at 6122490219 (in the [place].). Summary Chronic pain is a type of pain that lasts or keeps coming back for at least 3-6 months. Chronic pain may be related to an illness, injury, or other health condition. Sometimes, the cause of chronic pain is not known. Treatment depends on the cause and severity of your pain. Many people benefit from a combination of two or more types of treatment to control their pain. Follow your treatment plan as told by your health care provider. This information is not intended to replace advice given to you by your health care provider. Make sure you discuss any questions you have with your health care  provider. Document Revised: 09/12/2021 Document Reviewed: 01/13/2019 Elsevier Patient Education  [place].
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
## 2022-02-06 DIAGNOSIS — Z20822 Contact with and (suspected) exposure to covid-19: Secondary | ICD-10-CM | POA: Diagnosis not present

## 2022-02-06 DIAGNOSIS — M654 Radial styloid tenosynovitis [de Quervain]: Secondary | ICD-10-CM | POA: Diagnosis not present

## 2022-02-06 DIAGNOSIS — Z760 Encounter for issue of repeat prescription: Secondary | ICD-10-CM | POA: Diagnosis not present

## 2022-02-06 DIAGNOSIS — B9789 Other viral agents as the cause of diseases classified elsewhere: Secondary | ICD-10-CM | POA: Diagnosis not present

## 2022-02-06 DIAGNOSIS — J028 Acute pharyngitis due to other specified organisms: Secondary | ICD-10-CM | POA: Diagnosis not present

## 2022-02-13 ENCOUNTER — Other Ambulatory Visit: Payer: Self-pay

## 2022-02-13 ENCOUNTER — Encounter (HOSPITAL_BASED_OUTPATIENT_CLINIC_OR_DEPARTMENT_OTHER): Payer: Self-pay

## 2022-02-13 ENCOUNTER — Emergency Department (HOSPITAL_BASED_OUTPATIENT_CLINIC_OR_DEPARTMENT_OTHER)
Admission: EM | Admit: 2022-02-13 | Discharge: 2022-02-13 | Disposition: A | Discharge status: Home / Self Care | Payer: Medicare HMO | Attending: Emergency Medicine | Admitting: Emergency Medicine

## 2022-02-13 ENCOUNTER — Emergency Department (HOSPITAL_BASED_OUTPATIENT_CLINIC_OR_DEPARTMENT_OTHER): Payer: Medicare HMO

## 2022-02-13 DIAGNOSIS — I10 Essential (primary) hypertension: Secondary | ICD-10-CM | POA: Diagnosis not present

## 2022-02-13 DIAGNOSIS — Z79899 Other long term (current) drug therapy: Secondary | ICD-10-CM | POA: Insufficient documentation

## 2022-02-13 DIAGNOSIS — K0889 Other specified disorders of teeth and supporting structures: Secondary | ICD-10-CM | POA: Diagnosis not present

## 2022-02-13 DIAGNOSIS — Z20822 Contact with and (suspected) exposure to covid-19: Secondary | ICD-10-CM | POA: Insufficient documentation

## 2022-02-13 DIAGNOSIS — J029 Acute pharyngitis, unspecified: Secondary | ICD-10-CM | POA: Diagnosis not present

## 2022-02-13 DIAGNOSIS — R221 Localized swelling, mass and lump, neck: Secondary | ICD-10-CM | POA: Diagnosis not present

## 2022-02-13 LAB — CBC WITH DIFFERENTIAL/PLATELET
Abs Immature Granulocytes: 0.02 10*3/uL (ref 0.00–0.07)
Basophils Absolute: 0 10*3/uL (ref 0.0–0.1)
Basophils Relative: 1 %
Eosinophils Absolute: 0 10*3/uL (ref 0.0–0.5)
Eosinophils Relative: 0 %
HCT: 35.1 % — ABNORMAL LOW (ref 36.0–46.0)
Hemoglobin: 12.2 g/dL (ref 12.0–15.0)
Immature Granulocytes: 0 %
Lymphocytes Relative: 21 %
Lymphs Abs: 1 10*3/uL (ref 0.7–4.0)
MCH: 31.1 pg (ref 26.0–34.0)
MCHC: 34.8 g/dL (ref 30.0–36.0)
MCV: 89.5 fL (ref 80.0–100.0)
Monocytes Absolute: 0.2 10*3/uL (ref 0.1–1.0)
Monocytes Relative: 5 %
Neutro Abs: 3.7 10*3/uL (ref 1.7–7.7)
Neutrophils Relative %: 73 %
Platelets: 277 10*3/uL (ref 150–400)
RBC: 3.92 MIL/uL (ref 3.87–5.11)
RDW: 13.3 % (ref 11.5–15.5)
WBC: 5 10*3/uL (ref 4.0–10.5)
nRBC: 0 % (ref 0.0–0.2)

## 2022-02-13 LAB — LACTIC ACID, PLASMA: Lactic Acid, Venous: 1.3 mmol/L (ref 0.5–1.9)

## 2022-02-13 LAB — BASIC METABOLIC PANEL
Anion gap: 8 (ref 5–15)
BUN: 12 mg/dL (ref 6–20)
CO2: 25 mmol/L (ref 22–32)
Calcium: 8.7 mg/dL — ABNORMAL LOW (ref 8.9–10.3)
Chloride: 105 mmol/L (ref 98–111)
Creatinine, Ser: 0.74 mg/dL (ref 0.44–1.00)
GFR, Estimated: >60 mL/min (ref >60)
Glucose, Bld: 107 mg/dL — ABNORMAL HIGH (ref 70–99)
Potassium: 3.6 mmol/L (ref 3.5–5.1)
Sodium: 138 mmol/L (ref 135–145)

## 2022-02-13 LAB — SARS CORONAVIRUS 2 BY RT PCR: SARS Coronavirus 2 by RT PCR: NEGATIVE

## 2022-02-13 LAB — GROUP A STREP BY PCR: Group A Strep by PCR: NOT DETECTED

## 2022-02-13 MED ORDER — AMOXICILLIN-POT CLAVULANATE 875-125 MG PO TABS: 1.0000 | ORAL_TABLET | Freq: Two times a day (BID) | ORAL | 0 refills | Status: AC

## 2022-02-13 MED ORDER — DEXAMETHASONE 10 MG/ML FOR PEDIATRIC ORAL USE
10.0000 mg | Freq: Once | INTRAMUSCULAR | Status: AC
  Administered 2022-02-13: 10 mg via ORAL
  Filled 2022-02-13: qty 1

## 2022-02-13 MED ORDER — FLUCONAZOLE 150 MG PO TABS
150.0000 mg | ORAL_TABLET | Freq: Once | ORAL | 0 refills | Status: AC
Start: 2022-02-13 — End: 2022-02-13

## 2022-02-13 MED ORDER — IOHEXOL 300 MG/ML  SOLN
100.0000 mL | Freq: Once | INTRAMUSCULAR | Status: AC | PRN
  Administered 2022-02-13: 75 mL via INTRAVENOUS

## 2022-02-13 NOTE — Discharge Instructions (Signed)
Please start taking the antibiotic that I prescribed for you.  You were given a dose of Decadron here that will help with your inflammation and swelling in your throat.

## 2022-02-13 NOTE — ED Provider Notes (Signed)
[facility] Provider Note   CSN: [id number] Arrival date & time: 02/13/22  1111     History PMH: HTN, interstitial lung disease Chief Complaint  Patient presents with   Dental Pain   Sore Throat    Caitlin White is a 56 y.o. female. Patient is presenting to the ED with 1 week of dental pain on one of her incisor teeth that she has a cap on.  Throughout this week she started noticing a sore throat and difficulty swallowing.  She states that it is gotten to the point where she has pain underneath her tongue as well as pain in her neck underneath her mouth.  She has associated pressure in both the ears.  She denies any fevers, chills, cough, congestion, chest pain, shortness of breath, nausea, vomiting, abdominal pain.   Dental Pain Sore Throat       Home Medications Prior to Admission medications   Medication Sig Start Date End Date Taking? Authorizing Provider  amoxicillin-clavulanate (AUGMENTIN) 875-125 MG tablet Take 1 tablet by mouth every 12 (twelve) hours for 7 days. 02/13/22 02/20/22 Yes Radwan Cowley, Adora Fridge, PA-C  cyclobenzaprine (FLEXERIL) 10 MG tablet Take 1 tablet (10 mg total) by mouth at bedtime. 02/05/22   Dohmeier, Asencion Partridge, MD  gabapentin (NEURONTIN) 800 MG tablet Take 800 mg by mouth 3 (three) times daily.    [provider]  levothyroxine (SYNTHROID) 175 MCG tablet Take 1 tablet (175 mcg total) by mouth daily before breakfast. 02/24/21 07/04/21  Sponseller, Eugene Garnet R, PA-C  levothyroxine (SYNTHROID) 200 MCG tablet  03/13/21   [provider]  losartan (COZAAR) 100 MG tablet Take by mouth.    [provider]  losartan-hydrochlorothiazide Konrad Penta) 100-12.5 MG tablet  03/28/21   [provider]  methylPREDNISolone (MEDROL DOSEPAK) 4 MG TBPK tablet 6 day dose pack - take as directed 10/01/21   Criselda Peaches, DPM  metoprolol succinate (TOPROL-XL) 25 MG 24 hr tablet Take 2 tablets (50 mg total) by mouth  daily. 02/24/21 05/23/21  Sponseller, Eugene Garnet R, PA-C  metoprolol succinate (TOPROL-XL) 50 MG 24 hr tablet  03/28/21   [provider]  NIFEdipine (PROCARDIA XL/NIFEDICAL XL) 60 MG 24 hr tablet Take 1 tablet (60 mg total) by mouth daily. 02/24/21 05/23/21  Sponseller, Gypsy Balsam, PA-C  omeprazole (PRILOSEC) 20 MG capsule Take 20 mg by mouth daily.    [provider]  potassium chloride (KLOR-CON M) 10 MEQ tablet Take by mouth.    [provider]  potassium chloride SA (KLOR-CON) 20 MEQ tablet Take 1 tablet (20 mEq total) by mouth daily for 15 days. 02/24/21 07/04/21  Sponseller, Eugene Garnet R, PA-C  VITAMIN D PO Take by mouth.    [provider]      Allergies    Patient has no known allergies.    Review of Systems   Review of Systems  HENT:  Positive for dental problem, sore throat and trouble swallowing.   All other systems reviewed and are negative.   Physical Exam Updated Vital Signs BP (!) 153/81 (BP Location: Left Arm)   Pulse 71   Temp 98.3 F (36.8 C) (Oral)   Resp 18   Ht '6\' 1"'$  (1.854 m)   Wt 117.9 kg   SpO2 100%   BMI 34.30 kg/m  Physical Exam Vitals and nursing note reviewed.  Constitutional:      General: She is not in acute distress.    Appearance: Normal appearance. She is well-developed. She is  not ill-appearing, toxic-appearing or diaphoretic.  HENT:     Head: Normocephalic and atraumatic.     Jaw: No trismus.     Comments: Tenderness underneath the submandibular space.    Right Ear: Hearing, tympanic membrane, ear canal and external ear normal.     Left Ear: Hearing, tympanic membrane, ear canal and external ear normal.     Nose: No nasal deformity.     Right Sinus: No maxillary sinus tenderness or frontal sinus tenderness.     Left Sinus: No maxillary sinus tenderness or frontal sinus tenderness.     Mouth/Throat:     Lips: Pink. No lesions.     Mouth: Mucous membranes are moist.     Dentition: Dental tenderness present.      Pharynx: Oropharynx is clear. Uvula midline. No pharyngeal swelling, oropharyngeal exudate, posterior oropharyngeal erythema or uvula swelling.     Tonsils: No tonsillar exudate or tonsillar abscesses. 0 on the right. 0 on the left.      Comments: Tenderness present, possible small periapical abscess noted.  Tongue slightly raised more the the right of the mouth. She is able to push her tongue back down. She has some erythema on the left side of the base of her tongue without significant swelling there. The right side seems normal.  Speech slightly hoarse Eyes:     General: Gaze aligned appropriately. No scleral icterus.       Right eye: No discharge.        Left eye: No discharge.     Conjunctiva/sclera: Conjunctivae normal.     Right eye: Right conjunctiva is not injected. No exudate or hemorrhage.    Left eye: Left conjunctiva is not injected. No exudate or hemorrhage. Neck:     Comments: Left sided cervical lymphadenopathy Pulmonary:     Effort: Pulmonary effort is normal. No respiratory distress.  Lymphadenopathy:     Cervical: Cervical adenopathy present.  Skin:    General: Skin is warm and dry.  Neurological:     Mental Status: She is alert and oriented to person, place, and time.  Psychiatric:        Mood and Affect: Mood normal.        Speech: Speech normal.        Behavior: Behavior normal. Behavior is cooperative.     ED Results / Procedures / Treatments   Labs (all labs ordered are listed, but only abnormal results are displayed) Labs Reviewed  BASIC METABOLIC PANEL - Abnormal; Notable for the following components:      Result Value   Glucose, Bld 107 (*)    Calcium 8.7 (*)    All other components within normal limits  CBC WITH DIFFERENTIAL/PLATELET - Abnormal; Notable for the following components:   HCT 35.1 (*)    All other components within normal limits  GROUP A STREP BY PCR  SARS CORONAVIRUS 2 BY RT PCR  LACTIC ACID, PLASMA     EKG None  Radiology CT Soft Tissue Neck W Contrast  Result Date: 02/13/2022 CLINICAL DATA:  Soft swelling. Infection suspected. Sore throat and swelling under the tongue. EXAM: CT NECK WITH CONTRAST TECHNIQUE: Multidetector CT imaging of the neck was performed using the standard protocol following the bolus administration of intravenous contrast. RADIATION DOSE REDUCTION: This exam was performed according to the departmental dose-optimization program which includes automated exposure control, adjustment of the mA and/or kV according to patient size and/or use of iterative reconstruction technique. CONTRAST:  58m OMNIPAQUE IOHEXOL 300  MG/ML  SOLN COMPARISON:  None available FINDINGS: Pharynx and larynx: No focal mucosal or submucosal lesions are present. Soft palate and tongue base are within normal limits. Scratched at soft palate is within normal limits. Mild prominence of the palatine tonsils an lingual tonsils are present without a discrete mass. Parapharyngeal fat is clear. Vallecula and epiglottis are within normal limits. Aryepiglottic folds and piriform sinuses are clear. Vocal cords are midline and symmetric. Trachea is clear. Salivary glands: The submandibular and parotid glands and ducts are within normal limits. Thyroid: Status post thyroidectomy. No significant residual glandular tissue is present Lymph nodes: Mild prominence of submandibular lymph nodes seen bilaterally. The largest is a right-sided node measuring 18 x 10 mm. No rounded or hyperdense nodes are present. No other significant cervical adenopathy. Vascular: Negative. Limited intracranial: Within normal limits. Visualized orbits: The globes and orbits are within normal limits. Mastoids and visualized paranasal sinuses: The paranasal sinuses and mastoid air cells are clear. Skeleton: Sclerotic endplate degenerative changes present at C4-5 and C5-6. Vertebral body heights are normal. No other focal lesions are present. Upper  chest: Ill-defined ground-glass attenuation is present in the posterior right upper lobe on image 100 of series 5. Left lung apex is clear. The thoracic inlet is within normal limits. IMPRESSION: 1. Mild prominence of the palatine tonsils and lingual tonsils are present without a discrete mass. This may represent acute pharyngitis. No focal abscess is present. 2. Status post thyroidectomy. No significant residual glandular tissue is present. 3. Mild prominence of submandibular lymph nodes bilaterally. The largest is a right-sided node measuring 18 x 10 mm. No rounded or hyperdense nodes are present. These are likely reactive. 4. Ill-defined ground-glass attenuation in the posterior right upper lobe. This may represent atelectasis or infection. 5. Degenerative changes in the cervical spine are most significant at C4-5 and C5-6. Electronically Signed   By: San Morelle M.D.   On: 02/13/2022 14:31    Procedures Procedures   Medications Ordered in ED Medications  iohexol (OMNIPAQUE) 300 MG/ML solution 100 mL (75 mLs Intravenous Contrast Given 02/13/22 1403)  dexamethasone (DECADRON) 10 MG/ML injection for Pediatric ORAL use 10 mg (10 mg Oral Given 02/13/22 1446)    ED Course/ Medical Decision Making/ A&P                           Medical Decision Making Amount and/or Complexity of Data Reviewed Labs: ordered. Radiology: ordered.  Risk Prescription drug management.    MDM  This is a 56 y.o. female who presents to the ED with dental pain and sore throat The differential of this patient includes but is not limited to Epiglottitis, Foreign Body, Gonorrhea/Chlamydia, Ludwig's Angina, Mononucleosis, Peritonsillar Abscess, Retropharyngeal Abscess, Strep throat, and Viral Pharyngitis, salivary stone  Initial Impression  Appears to feel unwell. Afebrile and stable vitals. It is a little bit concerning to hear that patient is now having trouble with swallowing and has pain and tenderness  underneath her tongue in the setting of possible dental infection. I feel that it is appropriate to obtain CT soft tissue neck to evaluate for Ludwigs versus sialoadenitis versus other deep space infection. Her airway is intact and seems to be tolerating secretions. I do not visualize a PTA.   I personally ordered, reviewed, and interpreted all laboratory work and imaging and agree with radiologist interpretation. Results interpreted below: No leukocytosis or anemia present.  Renal function normal.  No electrolyte abnormalities.  Lactic acid  normal.  COVID and strep are negative.  CT soft tissue neck reveals mild prominence of the palatine tonsils and lingual tonsils representing likely acute pharyngitis. Bilateral submandibular lymph nodes prominent. Lso an ill defined ground glass attenuation in posterior right upper lobe of lung.    Assessment/Plan:  Workup concerning for acute pharyngitis with reactive lymphadenopathy. Possible PNA seen on Chest CT. I have given her a dose of Decadron here for pharyngitis. Will likely also treat for infection given this ground glass appearance. Augmentin should cover for intraoral and an atypical PNA. I have discussed findings with patient and she has no admission needs. She will follow up outpatient and given return precautions   Charting Requirements Additional history is obtained from:  Independent historian External Records from outside source obtained and reviewed including: n/a Social Determinants of Health:  none Pertinant PMH that complicates patient's illness: n/a  Patient Care Problems that were addressed during this visit: - Pharyngitis: Acute illness with systemic symptoms  This patient was maintained on a cardiac monitor/telemetry. I personally viewed and interpreted the cardiac monitor which reveals an underlying rhythm of NSR Medications given in ED: Decadron Reevaluation of the patient after these medicines showed that the patient  improved I have reviewed home medications and made changes accordingly.  Critical Care Interventions: n/a Consultations: n/a Disposition: discharge   Portions of this note were generated with Dragon dictation software. Dictation errors may occur despite best attempts at proofreading.         Final Clinical Impression(s) / ED Diagnoses Final diagnoses:  Pharyngitis, unspecified etiology    Rx / DC Orders ED Discharge Orders          Ordered    amoxicillin-clavulanate (AUGMENTIN) 875-125 MG tablet  Every 12 hours        02/13/22 1512              Laronn Devonshire, Adora Fridge, PA-C 02/13/22 1548    Audley Hose, MD 02/13/22 1554

## 2022-02-13 NOTE — ED Notes (Signed)
Patient transported to CT 

## 2022-02-13 NOTE — ED Triage Notes (Signed)
Patient c/o bottom front tooth pain with sore throat and left ear pain x 10 days.

## 2022-02-24 DIAGNOSIS — M25532 Pain in left wrist: Secondary | ICD-10-CM | POA: Diagnosis not present

## 2022-03-17 ENCOUNTER — Telehealth: Payer: Self-pay | Admitting: Neurology

## 2022-03-17 NOTE — Telephone Encounter (Signed)
Human pending uploaded notes

## 2022-04-02 ENCOUNTER — Ambulatory Visit: Payer: Medicare HMO | Admitting: Neurology

## 2022-04-21 NOTE — Telephone Encounter (Signed)
[employer] approved the NPSG but it will expire before the patient has her SS. I will redo the auth when the time gets closer.  The old [place] Staggers: [id number] (exp. 03/17/22 to 06/15/22)  Patient is scheduled at [facility] for 06/22/22 at 8 pm.  Mailed packet to the patient.

## 2022-04-25 ENCOUNTER — Telehealth: Payer: Self-pay | Admitting: Neurology

## 2022-04-25 NOTE — Telephone Encounter (Signed)
Caitlin White is calling from [place]. Requesting pt last office visit note can be faxed [phone]

## 2022-04-30 DIAGNOSIS — J069 Acute upper respiratory infection, unspecified: Secondary | ICD-10-CM | POA: Diagnosis not present

## 2022-04-30 DIAGNOSIS — B9689 Other specified bacterial agents as the cause of diseases classified elsewhere: Secondary | ICD-10-CM | POA: Diagnosis not present

## 2022-04-30 DIAGNOSIS — J329 Chronic sinusitis, unspecified: Secondary | ICD-10-CM | POA: Diagnosis not present

## 2022-05-20 NOTE — Telephone Encounter (Signed)
Patient has new insurance, [employer].   I started a case on [employer] and uploaded notes on the portal it is pending at this time.

## 2022-05-21 NOTE — Telephone Encounter (Signed)
Checked status on the portal, it is still pending.

## 2022-05-28 NOTE — Telephone Encounter (Signed)
Checked the status on the [employer] portal for her [employer], it is still pending.

## 2022-06-04 NOTE — Telephone Encounter (Signed)
The patient new insurance [employer] denied the NPSG.  "A sleep test in the lab was asked for. The request cannot be approved because:   -There has been a decrease in body mass index or BMI of 10% when there is a desire to stop positive airway pressure or PAP treatment in there is an intolerance to PAP. -To reassess for OSA that continues after surgery for moderate to severe OSA, after a trial with an oral appliance, or after other treatments such as positional therapy.  - a test similar to the this one has already been performed (when false a test has not been performed and I reached out to the insurance company on 05/28/2022 & told them that the patient has not had this study done yet.  There is an option to do an appeal. A letter would need to be written on why the study is needed and I will fax it to Attn: Appeals at [phone]. It can take up to 60 days to get an answer back.   Right now her NPSG is scheduled for Sunday 06/22/22.  Or she could proceed with having a HST.

## 2022-06-05 NOTE — Telephone Encounter (Signed)
Noted, thank you.  I spoke with the patient and informed her that her insurance denied the NPSG.  HST- Caitlin White no Caitlin White ref # [id number]   She is scheduled at [facility] for 06/24/22 at 10 AM.   Mailed packet to the patient.

## 2022-06-18 DIAGNOSIS — M654 Radial styloid tenosynovitis [de Quervain]: Secondary | ICD-10-CM | POA: Diagnosis not present

## 2022-06-18 DIAGNOSIS — J069 Acute upper respiratory infection, unspecified: Secondary | ICD-10-CM | POA: Diagnosis not present

## 2022-06-24 ENCOUNTER — Ambulatory Visit (INDEPENDENT_AMBULATORY_CARE_PROVIDER_SITE_OTHER): Payer: Medicare HMO | Admitting: Neurology

## 2022-06-24 DIAGNOSIS — G4710 Hypersomnia, unspecified: Secondary | ICD-10-CM

## 2022-06-24 DIAGNOSIS — G8929 Other chronic pain: Secondary | ICD-10-CM

## 2022-06-24 DIAGNOSIS — R0683 Snoring: Secondary | ICD-10-CM

## 2022-06-24 DIAGNOSIS — G4719 Other hypersomnia: Secondary | ICD-10-CM

## 2022-06-24 DIAGNOSIS — Z91199 Patient's noncompliance with other medical treatment and regimen due to unspecified reason: Secondary | ICD-10-CM

## 2022-06-24 DIAGNOSIS — G4701 Insomnia due to medical condition: Secondary | ICD-10-CM

## 2022-06-24 DIAGNOSIS — Z72821 Inadequate sleep hygiene: Secondary | ICD-10-CM

## 2022-06-26 NOTE — Progress Notes (Signed)
[facility] TEST REPORT ( by Watch PAT)   STUDY DATE: 06-25-2022  DOB:  [birth year] MRN:    ORDERING CLINICIAN: Larey Seat, MD  REFERRING CLINICIAN: Dr Bynum Bellows [facility].   CLINICAL INFORMATION/HISTORY: New Patient  seen on 02-05-2022 for EDS,  She is referred officially for RLS management, but stated she has GERD with apnea, is non compliant with CPAP and hasn't had a sleep study in over 5 years. She was first diagnosed in 2013 , before she underwent bariatric surgery, a gastric sleeve surgery in [place]. She moved here form [place] just September 2022. Neuropathy started before weight loss surgery. I had thyroidectomy, "not for cancer"- (?)> She is a workman's comp case for chronic pain, which we cannot address in Neurology and we dont' see WC. She also stated that RLS is no longer an issue for her.    Epworth sleepiness score: 21/24. FSS at 11/ 63 points (!)  Her fatigue went away after she quit Ropinirole.    BMI: 34.5 kg/m   Neck Circumference: 16   FINDINGS:   Sleep Summary:   Total Recording Time (hours, min):   7 hours 29 minutes  Total Sleep Time (hours, min): 6 hours 8 minutes               Percent REM (%):   22%                                     Respiratory Indices:   Calculated pAHI (per hour): 34/h                           REM pAHI:   50/h                                              NREM pAHI:   30/h                           Positional AHI: The patient slept predominantly in supine position associated with an AHI of 34.5 and an RDI of 38.2/h.  Snoring statistics show a mean volume of 40 dB, which is just at threshold.  REM sleep was associated with severe oxygen desaturation                                                  Oxygen Saturation Statistics:    O2 Saturation Range (%): Between 65% saturation at nadir and a maximum of 99% -with a mean saturation at 94%                                     O2 Saturation  (minutes) <89%: 26 minutes. O2 saturation in minutes below 90%; 30 minutes           Pulse Rate Statistics:   Pulse Mean (bpm):    70 bpm             Pulse  Range:   Between 51 and 101 bpm.  Please note that this home sleep test cannot give cardiac rhythm data only the heart rate is captured here.              IMPRESSION:  This HST confirms the presence of severe REM sleep dependent sleep apnea.  Positional dependency could not be established as the patient only slept supine.  There is moderate severe hypoxia associated with REM sleep noted. This kind of apnea cannot be treated in any other way but with positive airway pressure.   RECOMMENDATION: I would like to invite the patient for an in lab titration which helps [place] to fit the best mask for her, see if she needs additional treatment for nasal passage airflow facilitation or a chinstrap, etc.  [place] Medicare is unlikely to permit such an attended titration study so I will order an auto titration device as Plan B.  The foreseeable complication is that the patient has been Noncompliant with CPAP in the past and that an auto titration device will not address anxiety, claustrophobia etc. from the onset of therapy onwards. Auto CPAP 5-20 cm water pressure, 2 cm EPR and heated humidification, with one on one fitting for an interface. I will also write for nasal spray to allow better nasal airflow.         INTERPRETING PHYSICIAN:   Larey Seat, MD   Medical Director of [facility] at [facility].

## 2022-06-30 ENCOUNTER — Telehealth: Payer: Self-pay

## 2022-06-30 DIAGNOSIS — Z91199 Patient's noncompliance with other medical treatment and regimen due to unspecified reason: Secondary | ICD-10-CM | POA: Insufficient documentation

## 2022-06-30 DIAGNOSIS — R0683 Snoring: Secondary | ICD-10-CM | POA: Insufficient documentation

## 2022-06-30 DIAGNOSIS — Z72821 Inadequate sleep hygiene: Secondary | ICD-10-CM

## 2022-06-30 HISTORY — DX: Snoring: R06.83

## 2022-06-30 HISTORY — DX: Inadequate sleep hygiene: Z72.821

## 2022-06-30 HISTORY — DX: Patient's noncompliance with other medical treatment and regimen due to unspecified reason: Z91.199

## 2022-06-30 NOTE — Procedures (Signed)
[facility] TEST REPORT ( by Watch PAT)   STUDY DATE: 06-25-2022  DOB:  01/04/1966 MRN:    ORDERING CLINICIAN: Larey Seat, MD  REFERRING CLINICIAN: Dr Bynum Bellows [facility].   CLINICAL INFORMATION/HISTORY: New Patient  seen on 02-05-2022 for EDS,  She is referred officially for RLS management, but stated she has GERD with apnea, is non compliant with CPAP and hasn't had a sleep study in over 5 years. She was first diagnosed in 2013 , before she underwent bariatric surgery, a gastric sleeve surgery in [place]. She moved here form [place] just September 2022. Neuropathy started before weight loss surgery. I had thyroidectomy, "not for cancer"- (?)> She is a workman's comp case for chronic pain, which we cannot address in Neurology and we dont' see WC. She also stated that RLS is no longer an issue for her.    Epworth sleepiness score: 21/24. FSS at 11/ 63 points (!)  Her fatigue went away after she quit Ropinirole.    BMI: 34.5 kg/m   Neck Circumference: 16   FINDINGS:   Sleep Summary:   Total Recording Time (hours, min):   7 hours 29 minutes  Total Sleep Time (hours, min): 6 hours 8 minutes               Percent REM (%):   22%                                     Respiratory Indices:   Calculated pAHI (per hour): 34/h                           REM pAHI:   50/h                                              NREM pAHI:   30/h                           Positional AHI: The patient slept predominantly in supine position associated with an AHI of 34.5 and an RDI of 38.2/h.  Snoring statistics show a mean volume of 40 dB, which is just at threshold.  REM sleep was associated with severe oxygen desaturation                                                  Oxygen Saturation Statistics:    O2 Saturation Range (%): Between 65% saturation at nadir and a maximum of 99% -with a mean saturation at 94%                                     O2 Saturation (minutes)  <89%: 26 minutes. O2 saturation in minutes below 90%; 30 minutes           Pulse Rate Statistics:   Pulse Mean (bpm):    70 bpm             Pulse Range:   Between 51  and 101 bpm.  Please note that this home sleep test cannot give cardiac rhythm data only the heart rate is captured here.              IMPRESSION:  This HST confirms the presence of severe REM sleep dependent sleep apnea.  Positional dependency could not be established as the patient only slept supine.  There is moderate severe hypoxia associated with REM sleep noted. This kind of apnea cannot be treated in any other way but with positive airway pressure.   RECOMMENDATION: I would like to invite the patient for an in lab titration which helps [place] to fit the best mask for her, see if she needs additional treatment for nasal passage airflow facilitation or a chinstrap, etc.  [place] Medicare is unlikely to permit such an attended titration study so I will order an auto titration device as Plan B.  The foreseeable complication is that the patient has been Noncompliant with CPAP in the past and that an auto titration device will not address anxiety, claustrophobia etc. from the onset of therapy onwards. Auto CPAP 5-20 cm water pressure, 2 cm EPR and heated humidification, with one on one fitting for an interface. I will also write for nasal spray to allow better nasal airflow.         INTERPRETING PHYSICIAN:   Larey Seat, MD   Medical Director of [facility] at [facility].

## 2022-06-30 NOTE — Telephone Encounter (Signed)
This pt completed a home SS, Dr Dohmeier ordered a in lab titration study. In your opinion, do you think this will be approved or would pt need to try autoPAP first?

## 2022-06-30 NOTE — Addendum Note (Signed)
Addended by: Larey Seat on: 06/30/2022 12:37 PM   Modules accepted: Orders

## 2022-07-01 NOTE — Telephone Encounter (Addendum)
Contacted pt X2, straight to VM and VM full.

## 2022-07-09 DIAGNOSIS — I10 Essential (primary) hypertension: Secondary | ICD-10-CM | POA: Diagnosis not present

## 2022-07-09 DIAGNOSIS — E559 Vitamin D deficiency, unspecified: Secondary | ICD-10-CM | POA: Diagnosis not present

## 2022-07-09 DIAGNOSIS — E890 Postprocedural hypothyroidism: Secondary | ICD-10-CM | POA: Diagnosis not present

## 2022-07-09 DIAGNOSIS — K219 Gastro-esophageal reflux disease without esophagitis: Secondary | ICD-10-CM | POA: Diagnosis not present

## 2022-07-09 DIAGNOSIS — M654 Radial styloid tenosynovitis [de Quervain]: Secondary | ICD-10-CM | POA: Diagnosis not present

## 2022-07-10 ENCOUNTER — Encounter: Payer: Self-pay | Admitting: Neurology

## 2022-08-11 DIAGNOSIS — H1033 Unspecified acute conjunctivitis, bilateral: Secondary | ICD-10-CM | POA: Diagnosis not present

## 2022-08-30 DIAGNOSIS — Z809 Family history of malignant neoplasm, unspecified: Secondary | ICD-10-CM | POA: Diagnosis not present

## 2022-08-30 DIAGNOSIS — E559 Vitamin D deficiency, unspecified: Secondary | ICD-10-CM | POA: Diagnosis not present

## 2022-08-30 DIAGNOSIS — M1990 Unspecified osteoarthritis, unspecified site: Secondary | ICD-10-CM | POA: Diagnosis not present

## 2022-08-30 DIAGNOSIS — Z8249 Family history of ischemic heart disease and other diseases of the circulatory system: Secondary | ICD-10-CM | POA: Diagnosis not present

## 2022-08-30 DIAGNOSIS — I10 Essential (primary) hypertension: Secondary | ICD-10-CM | POA: Diagnosis not present

## 2022-08-30 DIAGNOSIS — Z6832 Body mass index (BMI) 32.0-32.9, adult: Secondary | ICD-10-CM | POA: Diagnosis not present

## 2022-08-30 DIAGNOSIS — Z91199 Patient's noncompliance with other medical treatment and regimen due to unspecified reason: Secondary | ICD-10-CM | POA: Diagnosis not present

## 2022-08-30 DIAGNOSIS — E039 Hypothyroidism, unspecified: Secondary | ICD-10-CM | POA: Diagnosis not present

## 2022-08-30 DIAGNOSIS — K219 Gastro-esophageal reflux disease without esophagitis: Secondary | ICD-10-CM | POA: Diagnosis not present

## 2022-08-30 DIAGNOSIS — E876 Hypokalemia: Secondary | ICD-10-CM | POA: Diagnosis not present

## 2022-08-30 DIAGNOSIS — E669 Obesity, unspecified: Secondary | ICD-10-CM | POA: Diagnosis not present

## 2022-09-26 ENCOUNTER — Emergency Department (HOSPITAL_BASED_OUTPATIENT_CLINIC_OR_DEPARTMENT_OTHER): Payer: Medicare HMO

## 2022-09-26 ENCOUNTER — Telehealth (HOSPITAL_BASED_OUTPATIENT_CLINIC_OR_DEPARTMENT_OTHER): Payer: Self-pay | Admitting: Emergency Medicine

## 2022-09-26 ENCOUNTER — Other Ambulatory Visit: Payer: Self-pay

## 2022-09-26 ENCOUNTER — Encounter (HOSPITAL_BASED_OUTPATIENT_CLINIC_OR_DEPARTMENT_OTHER): Payer: Self-pay | Admitting: Emergency Medicine

## 2022-09-26 ENCOUNTER — Emergency Department (HOSPITAL_BASED_OUTPATIENT_CLINIC_OR_DEPARTMENT_OTHER)
Admission: EM | Admit: 2022-09-26 | Discharge: 2022-09-26 | Disposition: A | Discharge status: Home / Self Care | Payer: Medicare HMO | Attending: Emergency Medicine | Admitting: Emergency Medicine

## 2022-09-26 DIAGNOSIS — S46811A Strain of other muscles, fascia and tendons at shoulder and upper arm level, right arm, initial encounter: Secondary | ICD-10-CM | POA: Diagnosis not present

## 2022-09-26 DIAGNOSIS — M19011 Primary osteoarthritis, right shoulder: Secondary | ICD-10-CM | POA: Diagnosis not present

## 2022-09-26 DIAGNOSIS — X58XXXA Exposure to other specified factors, initial encounter: Secondary | ICD-10-CM | POA: Diagnosis not present

## 2022-09-26 DIAGNOSIS — Z79899 Other long term (current) drug therapy: Secondary | ICD-10-CM | POA: Insufficient documentation

## 2022-09-26 DIAGNOSIS — R0789 Other chest pain: Secondary | ICD-10-CM | POA: Diagnosis not present

## 2022-09-26 DIAGNOSIS — I10 Essential (primary) hypertension: Secondary | ICD-10-CM | POA: Diagnosis not present

## 2022-09-26 DIAGNOSIS — R079 Chest pain, unspecified: Secondary | ICD-10-CM | POA: Insufficient documentation

## 2022-09-26 DIAGNOSIS — S4991XA Unspecified injury of right shoulder and upper arm, initial encounter: Secondary | ICD-10-CM | POA: Diagnosis present

## 2022-09-26 LAB — BASIC METABOLIC PANEL
Anion gap: 9 (ref 5–15)
BUN: 11 mg/dL (ref 6–20)
CO2: 27 mmol/L (ref 22–32)
Calcium: 8.7 mg/dL — ABNORMAL LOW (ref 8.9–10.3)
Chloride: 102 mmol/L (ref 98–111)
Creatinine, Ser: 0.79 mg/dL (ref 0.44–1.00)
GFR, Estimated: >60 mL/min (ref >60)
Glucose, Bld: 103 mg/dL — ABNORMAL HIGH (ref 70–99)
Potassium: 3.4 mmol/L — ABNORMAL LOW (ref 3.5–5.1)
Sodium: 138 mmol/L (ref 135–145)

## 2022-09-26 LAB — CBC
HCT: 34.3 % — ABNORMAL LOW (ref 36.0–46.0)
Hemoglobin: 11.8 g/dL — ABNORMAL LOW (ref 12.0–15.0)
MCH: 31.4 pg (ref 26.0–34.0)
MCHC: 34.4 g/dL (ref 30.0–36.0)
MCV: 91.2 fL (ref 80.0–100.0)
Platelets: 281 10*3/uL (ref 150–400)
RBC: 3.76 MIL/uL — ABNORMAL LOW (ref 3.87–5.11)
RDW: 13 % (ref 11.5–15.5)
WBC: 4.5 10*3/uL (ref 4.0–10.5)
nRBC: 0 % (ref 0.0–0.2)

## 2022-09-26 LAB — TROPONIN I (HIGH SENSITIVITY): Troponin I (High Sensitivity): 2 ng/L (ref <18)

## 2022-09-26 MED ORDER — METHOCARBAMOL 1000 MG PO TABS: 1000.0000 mg | ORAL_TABLET | Freq: Three times a day (TID) | ORAL | 0 refills | Status: DC | PRN

## 2022-09-26 MED ORDER — TIZANIDINE HCL 4 MG PO TABS: 4.0000 mg | ORAL_TABLET | Freq: Three times a day (TID) | ORAL | 0 refills | Status: DC | PRN

## 2022-09-26 MED ORDER — LIDOCAINE 5 % EX PTCH: 1.0000 | MEDICATED_PATCH | CUTANEOUS | 0 refills | Status: DC

## 2022-09-26 MED ORDER — POTASSIUM CHLORIDE CRYS ER 20 MEQ PO TBCR
20.0000 meq | EXTENDED_RELEASE_TABLET | Freq: Once | ORAL | Status: AC
  Administered 2022-09-26: 20 meq via ORAL
  Filled 2022-09-26: qty 1

## 2022-09-26 MED ORDER — KETOROLAC TROMETHAMINE 15 MG/ML IJ SOLN
15.0000 mg | Freq: Once | INTRAMUSCULAR | Status: AC
  Administered 2022-09-26: 15 mg via INTRAVENOUS
  Filled 2022-09-26: qty 1

## 2022-09-26 NOTE — ED Notes (Signed)

## 2022-09-26 NOTE — Telephone Encounter (Signed)
Patient is asking for muscle relaxer to be changed to tizanidine for insurance issues.

## 2022-09-26 NOTE — Discharge Instructions (Addendum)
Your symptoms today are most consistent with muscular pain.  It is unclear why exactly this is happening.  For now continue to use Tylenol we have also prescribed a topical anesthetic/numbing medicine as well as a muscle relaxer.  If you develop fever, new or worsening or uncontrolled pain, shortness of breath, new or worsening chest pain, or any other new/concerning symptoms then return to the ER or call 911.

## 2022-09-26 NOTE — ED Provider Notes (Signed)
[place] [facility] Provider Note   CSN: [id number] Arrival date & time: 09/26/22  6578     History  Chief Complaint  Patient presents with   Chest Pain    Caitlin White is a 57 y.o. female.  HPI 57 year old female with a history of hypertension, interstitial lung disease, chronic pain, presents with right-sided chest pain and shoulder pain.  Started without obvious trauma 2 nights ago.  Is significantly worse.  Denies any shortness of breath.  Any type of movement especially moving her shoulder hurts.  Also feels like is going into her right lateral neck.  No cough, fever, or recent illness.  No leg swelling.  Tylenol has not helped. Pain is severe.  Home Medications Prior to Admission medications   Medication Sig Start Date End Date Taking? Authorizing Provider  lidocaine (LIDODERM) 5 % Place 1 patch onto the skin daily. Remove & Discard patch within 12 hours or as directed by MD 09/26/22  Yes Pricilla Loveless, MD  methocarbamol 1000 MG TABS Take 1,000 mg by mouth every 8 (eight) hours as needed for muscle spasms. 09/26/22  Yes Pricilla Loveless, MD  cyclobenzaprine (FLEXERIL) 10 MG tablet Take 1 tablet (10 mg total) by mouth at bedtime. 02/05/22   Dohmeier, Porfirio Mylar, MD  gabapentin (NEURONTIN) 800 MG tablet Take 800 mg by mouth 3 (three) times daily.    [provider]  levothyroxine (SYNTHROID) 175 MCG tablet Take 1 tablet (175 mcg total) by mouth daily before breakfast. 02/24/21 07/04/21  Sponseller, Lupe Carney R, PA-C  levothyroxine (SYNTHROID) 200 MCG tablet  03/13/21   [provider]  losartan (COZAAR) 100 MG tablet Take by mouth.    [provider]  losartan-hydrochlorothiazide Mauri Reading) 100-12.5 MG tablet  03/28/21   [provider]  methylPREDNISolone (MEDROL DOSEPAK) 4 MG TBPK tablet 6 day dose pack - take as directed 10/01/21   Edwin Cap, DPM  metoprolol succinate (TOPROL-XL) 25 MG 24 hr tablet Take 2  tablets (50 mg total) by mouth daily. 02/24/21 05/23/21  Sponseller, Lupe Carney R, PA-C  metoprolol succinate (TOPROL-XL) 50 MG 24 hr tablet  03/28/21   [provider]  NIFEdipine (PROCARDIA XL/NIFEDICAL XL) 60 MG 24 hr tablet Take 1 tablet (60 mg total) by mouth daily. 02/24/21 05/23/21  Sponseller, Eugene Gavia, PA-C  omeprazole (PRILOSEC) 20 MG capsule Take 20 mg by mouth daily.    [provider]  potassium chloride (KLOR-CON M) 10 MEQ tablet Take by mouth.    [provider]  potassium chloride SA (KLOR-CON) 20 MEQ tablet Take 1 tablet (20 mEq total) by mouth daily for 15 days. 02/24/21 07/04/21  Sponseller, Lupe Carney R, PA-C  VITAMIN D PO Take by mouth.    [provider]      Allergies    Patient has no known allergies.    Review of Systems   Review of Systems  Constitutional:  Negative for fever.  Respiratory:  Negative for cough and shortness of breath.   Cardiovascular:  Positive for chest pain.  Gastrointestinal:  Negative for abdominal pain.  Musculoskeletal:  Positive for myalgias.    Physical Exam Updated Vital Signs BP 132/84   Pulse 78   Temp 98.4 F (36.9 C) (Oral)   Resp 13   Ht 6\' 1"  (1.854 m)   Wt 112.9 kg   SpO2 100%   BMI 32.85 kg/m  Physical Exam Vitals and nursing note reviewed.  Constitutional:      General: She  is not in acute distress.    Appearance: She is well-developed. She is not ill-appearing or diaphoretic.  HENT:     Head: Normocephalic and atraumatic.  Cardiovascular:     Rate and Rhythm: Normal rate and regular rhythm.     Pulses:          Radial pulses are 2+ on the right side.     Heart sounds: Normal heart sounds.  Pulmonary:     Effort: Pulmonary effort is normal.     Breath sounds: Normal breath sounds.  Abdominal:     Palpations: Abdomen is soft.     Tenderness: There is no abdominal tenderness.  Musculoskeletal:     Right shoulder: Tenderness present. No swelling or bony tenderness. Decreased  range of motion.     Comments: Diffuse tenderness along right shoulder and right trapezius  Skin:    General: Skin is warm and dry.  Neurological:     Mental Status: She is alert.     Comments: 5/5 strength in BUE.     ED Results / Procedures / Treatments   Labs (all labs ordered are listed, but only abnormal results are displayed) Labs Reviewed  BASIC METABOLIC PANEL - Abnormal; Notable for the following components:      Result Value   Potassium 3.4 (*)    Glucose, Bld 103 (*)    Calcium 8.7 (*)    All other components within normal limits  CBC - Abnormal; Notable for the following components:   RBC 3.76 (*)    Hemoglobin 11.8 (*)    HCT 34.3 (*)    All other components within normal limits  TROPONIN I (HIGH SENSITIVITY)    EKG EKG Interpretation  Date/Time:  Friday Sep 26 2022 09:15:47 EDT Ventricular Rate:  76 PR Interval:  186 QRS Duration: 96 QT Interval:  384 QTC Calculation: 432 R Axis:   6 Text Interpretation: Sinus rhythm Low voltage, precordial leads Probable left ventricular hypertrophy Interpretation limited secondary to artifact otherwise unchanged compared to earlier in the day Confirmed by Pricilla Loveless [phone]) on 09/26/2022 10:14:12 AM  Radiology DG Shoulder Right  Result Date: 09/26/2022 CLINICAL DATA:  Provided history: Acute pain. No known injury. EXAM: RIGHT SHOULDER - 2+ VIEW COMPARISON:  PET CT 04/17/2021. FINDINGS: There is normal bony alignment. No evidence of acute osseous or articular abnormality. Mild-to-moderate degenerative changes of the acromioclavicular joint. IMPRESSION: 1. No evidence of acute osseous or articular abnormality. 2. Mild-to-moderate degenerative changes of the acromioclavicular joint. Electronically Signed   By: Jackey Loge D.O.   On: 09/26/2022 09:52   DG Chest 2 View  Result Date: 09/26/2022 CLINICAL DATA:  Chest pain. EXAM: CHEST - 2 VIEW COMPARISON:  CT chest 04/02/2021, PET-CT 04/17/2021 FINDINGS: Cardiac silhouette  and mediastinal contours are within normal limits. The lungs are clear. No pleural effusion pneumothorax. There is lower thoracic and upper lumbar spine posterior rod and screw fusion hardware again seen. Patchy sclerosis within multiple vertebral bodies as seen on prior CT and again raising question of for metastatic disease. Recommend correlation with results of bone biopsy 05/23/2021. IMPRESSION: No active cardiopulmonary disease. Electronically Signed   By: Neita Garnet M.D.   On: 09/26/2022 09:05    Procedures Procedures    Medications Ordered in ED Medications  ketorolac (TORADOL) 15 MG/ML injection 15 mg (15 mg Intravenous Given 09/26/22 0915)  potassium chloride SA (KLOR-CON M) CR tablet 20 mEq (20 mEq Oral Given 09/26/22 1019)    ED Course/ Medical  Decision Making/ A&P                             Medical Decision Making Amount and/or Complexity of Data Reviewed Labs: ordered.    Details: Normal troponin.  Chronic anemia.  Mild hypokalemia. Radiology: ordered and independent interpretation performed.    Details: No pneumonia or obvious shoulder bony abnormality ECG/medicine tests: ordered and independent interpretation performed.    Details: No change from baseline.  Risk Prescription drug management.   Patient presents with what seems to be muscular pain, primarily in her right trapezius though it does go into her chest as well.  Low suspicion for ACS, PE, dissection.  Given Toradol and is feeling a lot better.  Will recommend over-the-counter medications, will also give a short course of muscle relaxer and some topical anesthetic.  Will give return precautions.  Given her symptoms have been ongoing for 2 days I think 1 troponin sufficient, especially when combined with low suspicion for ACS.        Final Clinical Impression(s) / ED Diagnoses Final diagnoses:  Strain of right trapezius muscle, initial encounter    Rx / DC Orders ED Discharge Orders           Ordered    lidocaine (LIDODERM) 5 %  Every 24 hours        09/26/22 1007    methocarbamol 1000 MG TABS  Every 8 hours PRN        09/26/22 1007              Pricilla Loveless, MD 09/26/22 1024

## 2022-09-26 NOTE — ED Triage Notes (Signed)
Upper chest pain since last night.  Also c/o right shoulder pain.  Some sob.  No fever, no cough.

## 2022-10-07 ENCOUNTER — Encounter: Payer: Self-pay | Admitting: Family

## 2022-10-07 ENCOUNTER — Ambulatory Visit (INDEPENDENT_AMBULATORY_CARE_PROVIDER_SITE_OTHER): Payer: Medicare HMO | Admitting: Family

## 2022-10-07 VITALS — BP 132/84 | HR 88 | Ht 73.0 in | Wt 245.0 lb

## 2022-10-07 DIAGNOSIS — Z13220 Encounter for screening for lipoid disorders: Secondary | ICD-10-CM | POA: Diagnosis not present

## 2022-10-07 DIAGNOSIS — M25531 Pain in right wrist: Secondary | ICD-10-CM | POA: Diagnosis not present

## 2022-10-07 DIAGNOSIS — R7309 Other abnormal glucose: Secondary | ICD-10-CM

## 2022-10-07 DIAGNOSIS — E890 Postprocedural hypothyroidism: Secondary | ICD-10-CM

## 2022-10-07 DIAGNOSIS — I10 Essential (primary) hypertension: Secondary | ICD-10-CM | POA: Diagnosis not present

## 2022-10-07 DIAGNOSIS — J849 Interstitial pulmonary disease, unspecified: Secondary | ICD-10-CM

## 2022-10-07 DIAGNOSIS — G4719 Other hypersomnia: Secondary | ICD-10-CM

## 2022-10-07 DIAGNOSIS — M25532 Pain in left wrist: Secondary | ICD-10-CM

## 2022-10-07 DIAGNOSIS — R102 Pelvic and perineal pain: Secondary | ICD-10-CM | POA: Diagnosis not present

## 2022-10-07 DIAGNOSIS — Z1231 Encounter for screening mammogram for malignant neoplasm of breast: Secondary | ICD-10-CM

## 2022-10-07 LAB — CBC WITH DIFFERENTIAL/PLATELET
Basophils Absolute: 0 10*3/uL (ref 0.0–0.1)
Basophils Relative: 0.8 % (ref 0.0–3.0)
Eosinophils Absolute: 0.1 10*3/uL (ref 0.0–0.7)
Eosinophils Relative: 1.9 % (ref 0.0–5.0)
HCT: 35 % — ABNORMAL LOW (ref 36.0–46.0)
Hemoglobin: 11.9 g/dL — ABNORMAL LOW (ref 12.0–15.0)
Lymphocytes Relative: 43.1 % (ref 12.0–46.0)
Lymphs Abs: 2 10*3/uL (ref 0.7–4.0)
MCHC: 33.9 g/dL (ref 30.0–36.0)
MCV: 92.2 fl (ref 78.0–100.0)
Monocytes Absolute: 0.3 10*3/uL (ref 0.1–1.0)
Monocytes Relative: 7.4 % (ref 3.0–12.0)
Neutro Abs: 2.2 10*3/uL (ref 1.4–7.7)
Neutrophils Relative %: 46.8 % (ref 43.0–77.0)
Platelets: 317 10*3/uL (ref 150.0–400.0)
RBC: 3.8 Mil/uL — ABNORMAL LOW (ref 3.87–5.11)
RDW: 13.6 % (ref 11.5–15.5)
WBC: 4.7 10*3/uL (ref 4.0–10.5)

## 2022-10-07 LAB — COMPREHENSIVE METABOLIC PANEL
ALT: 12 U/L (ref 0–35)
AST: 16 U/L (ref 0–37)
Albumin: 3.9 g/dL (ref 3.5–5.2)
Alkaline Phosphatase: 74 U/L (ref 39–117)
BUN: 14 mg/dL (ref 6–23)
CO2: 29 mEq/L (ref 19–32)
Calcium: 9.3 mg/dL (ref 8.4–10.5)
Chloride: 102 mEq/L (ref 96–112)
Creatinine, Ser: 0.68 mg/dL (ref 0.40–1.20)
GFR: 98.09 mL/min (ref >60.00)
Glucose, Bld: 97 mg/dL (ref 70–99)
Potassium: 3.6 mEq/L (ref 3.5–5.1)
Sodium: 139 mEq/L (ref 135–145)
Total Bilirubin: 0.3 mg/dL (ref 0.2–1.2)
Total Protein: 7.4 g/dL (ref 6.0–8.3)

## 2022-10-07 LAB — HEMOGLOBIN A1C: Hgb A1c MFr Bld: 5.9 % (ref 4.6–6.5)

## 2022-10-07 LAB — LIPID PANEL
Cholesterol: 206 mg/dL — ABNORMAL HIGH (ref 0–200)
HDL: 54.1 mg/dL (ref >39.00)
LDL Cholesterol: 119 mg/dL — ABNORMAL HIGH (ref 0–99)
NonHDL: 151.86
Total CHOL/HDL Ratio: 4
Triglycerides: 162 mg/dL — ABNORMAL HIGH (ref 0.0–149.0)
VLDL: 32.4 mg/dL (ref 0.0–40.0)

## 2022-10-07 LAB — TSH: TSH: 4.52 u[IU]/mL (ref 0.35–5.50)

## 2022-10-07 NOTE — Patient Instructions (Signed)
Hi Caitlin White,   We have been trying to reach you to go over your sleep study results. Please give the office a call at your earliest convenience. [phone]   Take care.

## 2022-10-07 NOTE — Progress Notes (Addendum)
Caitlin White is a 57 y.o. female with the following history as recorded in Epic[employer]:  Patient Active Problem List   Diagnosis Date Noted   Poor sleep hygiene 06/30/2022   Snoring 06/30/2022   Poor compliance with CPAP treatment 06/30/2022   Insomnia secondary to chronic pain 02/05/2022   Excessive daytime sleepiness 02/05/2022   History of restless legs syndrome 02/05/2022   Failed back surgical syndrome 02/05/2022   Status post complete thyroidectomy 02/05/2022   Bone lesion 04/30/2021   H/O bilateral breast reduction surgery 12/26/2015   H/O abdominal supracervical subtotal hysterectomy 04/17/2014    Current Outpatient Medications  Medication Sig Dispense Refill   levothyroxine (SYNTHROID) 200 MCG tablet      losartan-hydrochlorothiazide (HYZAAR) 100-12.5 MG tablet      omeprazole (PRILOSEC) 20 MG capsule Take 20 mg by mouth daily.     potassium chloride (KLOR-CON M) 10 MEQ tablet Take by mouth.     tiZANidine (ZANAFLEX) 4 MG tablet Take 1 tablet (4 mg total) by mouth every 8 (eight) hours as needed for muscle spasms. 10 tablet 0   VITAMIN D PO Take by mouth.     No current facility-administered medications for this visit.    Allergies: Patient has no known allergies.  Past Medical History:  Diagnosis Date   Arthritis    Chronic pain    Hypertension    Interstitial lung disease (HCC)    Pneumonia    Sciatica     Past Surgical History:  Procedure Laterality Date   ABDOMINAL HYSTERECTOMY     BACK SURGERY      No family history on file.  Social History   Tobacco Use   Smoking status: Never    Passive exposure: Never   Smokeless tobacco: Never  Substance Use Topics   Alcohol use: Never    Subjective:  Presents today as a new patient- she has previously been under the care of [facility] providers until last month- she then established with a provider in [place], [place] according to records seen on EPIC; concerning pattern of non-compliance documented and she readily  admits that she has not been able to do requested follow ups by her previous providers;   History of hypertension; thyroidectomy; she notes she is on disability for her back- under care of provider in [place] who is managing this for her/ no records available for review;  Overdue to see her sleep specialist and pulmonologist; Complaining of wrist pain- right wrist pain; also went to ER recently with right shoulder pain;   Per patient, she is up to date on her colonoscopy- thinks was done in 2022- no records available; overdue for mammogram and wants referral to GYN; per records, she has had hysterectomy; as she is walking out of the office to get her labs done today, she mentions pain in her pelvic area most noticeable with walking/ changing positions;      Objective:  Vitals:   10/07/22 1042 10/07/22 1159  BP: (!) 142/84 132/84  Pulse: 88   SpO2: 96%   Weight: 245 lb (111.1 kg)   Height: 6\' 1"  (1.854 m)     General: Well developed, well nourished, in no acute distress  Skin : Warm and dry.  Head: Normocephalic and atraumatic  Eyes: Sclera and conjunctiva clear; pupils round and reactive to light; extraocular movements intact  Lungs: Respirations unlabored; clear to auscultation bilaterally without wheeze, rales, rhonchi  CVS exam: normal rate and regular rhythm.  Abdomen: Soft; nontender; nondistended; normoactive  bowel sounds; no masses or hepatosplenomegaly  Musculoskeletal: No deformities; no active joint inflammation  Extremities: No edema, cyanosis, clubbing  Vessels: Symmetric bilaterally  Neurologic: Alert and oriented; speech intact; face symmetrical; moves all extremities well; CNII-XII intact without focal deficit   Assessment:  1. Visit for screening mammogram   2. Bilateral wrist pain   3. ILD (interstitial lung disease) (HCC)   4. Primary hypertension   5. Lipid screening   6. Status post complete thyroidectomy   7. Elevated glucose   8. Pelvic pain   9.  Excessive daytime sleepiness     Plan:   Order updated; Referral to orthopedist; Overdue to see her pulmonologist- referral updated; Stable; continue same medication; check CBC, CMP; Check lipid panel; Check TSH today; Check Hgba1c; Will refer to GYN per patient request; will update pelvic ultrasound to evaluate ovaries- notes indicate that she has had hysterectomy; ? If pelvic pain is related to her chronic back issues- per patient, she has provider for chronic back issues in [place] and does plan to see that provider soon.  She is also provided information for her sleep apnea specialist to call and schedule follow up there- there have been numerous phone calls and letters mailed to her- she indicates she did not know she was supposed to do any type of follow up.   Patient does ask about getting a handicapped placard updated today- I explained to her that we would need some type of letter from her provider in [place] as there are no records available for review today. She expresses understanding.   No follow-ups on file.  Orders Placed This Encounter  Procedures   MM Digital Screening    Standing Status:   Future    Standing Expiration Date:   10/07/2023    Order Specific Question:   Reason for Exam (SYMPTOM  OR DIAGNOSIS REQUIRED)    Answer:   screening mammogram    Order Specific Question:   Is the patient pregnant?    Answer:   No    Order Specific Question:   Preferred imaging location?    Answer:   [facility]   US Pelvic Complete With Transvaginal    History of partial hysterectomy    Standing Status:   Future    Standing Expiration Date:   10/07/2023    Order Specific Question:   Reason for Exam (SYMPTOM  OR DIAGNOSIS REQUIRED)    Answer:   pelvic pain    Order Specific Question:   Preferred imaging location?    Answer:   [facility]   CBC with Differential/Platelet   Comp Met (CMET)   Lipid panel   TSH   Hemoglobin A1c   Ambulatory referral to  Orthopedics    Referral Priority:   Routine    Referral Type:   Consultation    Number of Visits Requested:   1   Ambulatory referral to Pulmonology    Referral Priority:   Routine    Referral Type:   Consultation    Referral Reason:   Specialty Services Required    Referred to Provider:   Chilton Greathouse, MD    Requested Specialty:   Pulmonary Disease    Number of Visits Requested:   1   Ambulatory referral to Obstetrics / Gynecology    Referral Priority:   Routine    Referral Type:   Consultation    Referral Reason:   Specialty Services Required    Requested Specialty:  Obstetrics and Gynecology    Number of Visits Requested:   1    Requested Prescriptions    No prescriptions requested or ordered in this encounter

## 2022-10-10 ENCOUNTER — Other Ambulatory Visit (INDEPENDENT_AMBULATORY_CARE_PROVIDER_SITE_OTHER): Payer: Medicare HMO

## 2022-10-10 ENCOUNTER — Ambulatory Visit (INDEPENDENT_AMBULATORY_CARE_PROVIDER_SITE_OTHER): Payer: Medicare HMO | Admitting: Orthopaedic Surgery

## 2022-10-10 ENCOUNTER — Encounter: Payer: Self-pay | Admitting: Orthopaedic Surgery

## 2022-10-10 DIAGNOSIS — M79642 Pain in left hand: Secondary | ICD-10-CM | POA: Diagnosis not present

## 2022-10-10 DIAGNOSIS — M79641 Pain in right hand: Secondary | ICD-10-CM

## 2022-10-10 MED ORDER — LIDOCAINE HCL 1 % IJ SOLN
0.3000 mL | INTRAMUSCULAR | Status: AC | PRN
Start: 2022-10-10 — End: 2022-10-10
  Administered 2022-10-10: .3 mL

## 2022-10-10 MED ORDER — METHYLPREDNISOLONE ACETATE 40 MG/ML IJ SUSP
13.3300 mg | INTRAMUSCULAR | Status: AC | PRN
  Administered 2022-10-10: 13.33 mg

## 2022-10-10 MED ORDER — BUPIVACAINE HCL 0.5 % IJ SOLN
0.3300 mL | INTRAMUSCULAR | Status: AC | PRN
Start: 2022-10-10 — End: 2022-10-10
  Administered 2022-10-10: .33 mL

## 2022-10-10 NOTE — Progress Notes (Signed)
Office Visit Note   Patient: Caitlin White           Date of Birth: Jul 14, 1965           MRN: [id number] Visit Date: 10/10/2022              Requested by: Olive Bass, FNP [address] [place],  [place] [address] PCP: Olive Bass, FNP   Assessment & Plan: Visit Diagnoses:  1. Bilateral hand pain     Plan: Impression is bilateral wrist de Quervain's tenosynovitis.  Symptoms are much worse on the right hand.  Treatment options explained and she would like to try a steroid injection.  We will immobilize with a thumb spica brace.  Follow-up as needed.  She would likely make an appointment to see me about her knees.  Follow-Up Instructions: No follow-ups on file.   Orders:  Orders Placed This Encounter  Procedures   XR Hand Complete Left   XR Hand Complete Right   No orders of the defined types were placed in this encounter.     Procedures: Hand/UE Inj: R extensor compartment 1 for de Quervain's tenosynovitis on 10/10/2022 10:12 AM Indications: pain Details: 25 G needle Medications: 0.3 mL lidocaine 1 %; 0.33 mL bupivacaine 0.5 %; 13.33 mg methylPREDNISolone acetate 40 MG/ML Outcome: tolerated well, no immediate complications Patient was prepped and draped in the usual sterile fashion.       Clinical Data: No additional findings.   Subjective: Chief Complaint  Patient presents with   Left Wrist - Pain   Right Wrist - Pain    HPI Caitlin White is a 57 year old female here for evaluation of bilateral wrist pain worse on the right.  Pain is over the radial styloid and worse with activity.  Pain is worse at night.  Denies any injuries.  Denies any numbness and tingling.  Denies any changes in activity recently. Review of Systems  Constitutional: Negative.   HENT: Negative.    Eyes: Negative.   Respiratory: Negative.    Cardiovascular: Negative.   Endocrine: Negative.   Musculoskeletal: Negative.   Neurological: Negative.    Hematological: Negative.   Psychiatric/Behavioral: Negative.    All other systems reviewed and are negative.    Objective: Vital Signs: There were no vitals taken for this visit.  Physical Exam Vitals and nursing note reviewed.  Constitutional:      Appearance: She is well-developed.  HENT:     Head: Atraumatic.     Nose: Nose normal.  Eyes:     Extraocular Movements: Extraocular movements intact.  Cardiovascular:     Pulses: Normal pulses.  Pulmonary:     Effort: Pulmonary effort is normal.  Abdominal:     Palpations: Abdomen is soft.  Musculoskeletal:     Cervical back: Neck supple.  Skin:    General: Skin is warm.     Capillary Refill: Capillary refill takes less than 2 seconds.  Neurological:     Mental Status: She is alert. Mental status is at baseline.  Psychiatric:        Behavior: Behavior normal.        Thought Content: Thought content normal.        Judgment: Judgment normal.     Ortho Exam Examination of bilateral wrist show positive Finkelstein's sign more so on the right.  Negative CMC grind test. Specialty Comments:  No specialty comments available.  Imaging: No results found.   PMFS History: Patient Active Problem  List   Diagnosis Date Noted   Poor sleep hygiene 06/30/2022   Snoring 06/30/2022   Poor compliance with CPAP treatment 06/30/2022   Insomnia secondary to chronic pain 02/05/2022   Excessive daytime sleepiness 02/05/2022   History of restless legs syndrome 02/05/2022   Failed back surgical syndrome 02/05/2022   Status post complete thyroidectomy 02/05/2022   Bone lesion 04/30/2021   H/O bilateral breast reduction surgery 12/26/2015   H/O abdominal supracervical subtotal hysterectomy 04/17/2014   Past Medical History:  Diagnosis Date   Arthritis    Chronic pain    Hypertension    Interstitial lung disease (HCC)    Pneumonia    Sciatica     No family history on file.  Past Surgical History:  Procedure Laterality Date    ABDOMINAL HYSTERECTOMY     BACK SURGERY     Social History   Occupational History   Not on file  Tobacco Use   Smoking status: Never    Passive exposure: Never   Smokeless tobacco: Never  Vaping Use   Vaping Use: Never used  Substance and Sexual Activity   Alcohol use: Never   Drug use: Never   Sexual activity: Not on file

## 2022-10-16 ENCOUNTER — Other Ambulatory Visit: Payer: Self-pay

## 2022-10-16 ENCOUNTER — Other Ambulatory Visit (INDEPENDENT_AMBULATORY_CARE_PROVIDER_SITE_OTHER): Payer: Medicare HMO

## 2022-10-16 ENCOUNTER — Ambulatory Visit (INDEPENDENT_AMBULATORY_CARE_PROVIDER_SITE_OTHER): Payer: Medicare HMO | Admitting: Orthopaedic Surgery

## 2022-10-16 VITALS — Ht 73.0 in | Wt 245.0 lb

## 2022-10-16 DIAGNOSIS — M25562 Pain in left knee: Secondary | ICD-10-CM

## 2022-10-16 DIAGNOSIS — M25561 Pain in right knee: Secondary | ICD-10-CM

## 2022-10-16 DIAGNOSIS — G8929 Other chronic pain: Secondary | ICD-10-CM

## 2022-10-16 MED ORDER — BUPIVACAINE HCL 0.5 % IJ SOLN
2.0000 mL | INTRAMUSCULAR | Status: AC | PRN
Start: 2022-10-16 — End: 2022-10-16
  Administered 2022-10-16: 2 mL via INTRA_ARTICULAR

## 2022-10-16 MED ORDER — METHYLPREDNISOLONE ACETATE 40 MG/ML IJ SUSP
40.0000 mg | INTRAMUSCULAR | Status: AC | PRN
  Administered 2022-10-16: 40 mg via INTRA_ARTICULAR

## 2022-10-16 MED ORDER — METHYLPREDNISOLONE ACETATE 40 MG/ML IJ SUSP
40.0000 mg | INTRAMUSCULAR | Status: AC | PRN
Start: 2022-10-16 — End: 2022-10-16
  Administered 2022-10-16: 40 mg via INTRA_ARTICULAR

## 2022-10-16 MED ORDER — LIDOCAINE HCL 1 % IJ SOLN
2.0000 mL | INTRAMUSCULAR | Status: AC | PRN
Start: 2022-10-16 — End: 2022-10-16
  Administered 2022-10-16: 2 mL

## 2022-10-16 NOTE — Progress Notes (Signed)
Office Visit Note   Patient: Caitlin White           Date of Birth: [birth year]           MRN: [id number] Visit Date: 10/16/2022              Requested by: Olive Bass, FNP [address] [place],  [place] [address] PCP: Olive Bass, FNP   Assessment & Plan: Visit Diagnoses:  1. Chronic pain of both knees     Plan: Impression 57 year old female with moderately severe bilateral knee osteoarthritis.  The patellofemoral arthritis seems to be the worst and the most symptomatic.  We long discussion on treatment options and she elected to try another round of steroid injections today.  She will follow-up with [place] as needed.  Follow-Up Instructions: No follow-ups on file.   Orders:  Orders Placed This Encounter  Procedures   XR Knee 1-2 Views Right   XR Knee 1-2 Views Left   No orders of the defined types were placed in this encounter.     Procedures: Large Joint Inj: bilateral knee on 10/16/2022 4:52 PM Indications: pain Details: 22 G needle  Arthrogram: No  Medications (Right): 2 mL lidocaine 1 %; 2 mL bupivacaine 0.5 %; 40 mg methylPREDNISolone acetate 40 MG/ML Medications (Left): 2 mL lidocaine 1 %; 2 mL bupivacaine 0.5 %; 40 mg methylPREDNISolone acetate 40 MG/ML Outcome: tolerated well, no immediate complications Patient was prepped and draped in the usual sterile fashion.       Clinical Data: No additional findings.   Subjective: Chief Complaint  Patient presents with   Left Knee - Pain   Right Knee - Pain    HPI Caitlin White is a 57 year old female who comes in for evaluation of bilateral knee pain.  I recently saw her for de Quervain's tenosynovitis and we did an injection.  She is wearing a thumb spica brace.  She is here today to be seen for bilateral knee pain for many years.  She moved to [facility] in 2022 but prior to that she was in [place] and underwent many years of treatment for knee osteoarthritis.  She has  undergone physical therapy and multiple rounds of cortisone and Visco injections.  She has symptoms of locking and popping and nighttime pain that wakes her up. Review of Systems  Constitutional: Negative.   HENT: Negative.    Eyes: Negative.   Respiratory: Negative.    Cardiovascular: Negative.   Endocrine: Negative.   Musculoskeletal: Negative.   Neurological: Negative.   Hematological: Negative.   Psychiatric/Behavioral: Negative.    All other systems reviewed and are negative.    Objective: Vital Signs: Ht 6\' 1"  (1.854 m)   Wt 245 lb (111.1 kg)   BMI 32.32 kg/m   Physical Exam Vitals and nursing note reviewed.  Constitutional:      Appearance: She is well-developed.  HENT:     Head: Atraumatic.     Nose: Nose normal.  Eyes:     Extraocular Movements: Extraocular movements intact.  Cardiovascular:     Pulses: Normal pulses.  Pulmonary:     Effort: Pulmonary effort is normal.  Abdominal:     Palpations: Abdomen is soft.  Musculoskeletal:     Cervical back: Neck supple.  Skin:    General: Skin is warm.     Capillary Refill: Capillary refill takes less than 2 seconds.  Neurological:     Mental Status: She is alert. Mental status  is at baseline.  Psychiatric:        Behavior: Behavior normal.        Thought Content: Thought content normal.        Judgment: Judgment normal.     Ortho Exam Examination bilateral knees show no joint effusion.  She has quite a bit of patellofemoral crepitus with range of motion.  Collaterals and cruciates are stable. Specialty Comments:  No specialty comments available.  Imaging: XR Knee 1-2 Views Right  Result Date: 10/16/2022 X-rays demonstrate moderately severe tricompartment osteoarthritis worst in the patellofemoral compartment  XR Knee 1-2 Views Left  Result Date: 10/16/2022 X-rays demonstrate moderately severe tricompartmental osteoarthritis worst in the patellofemoral compartment    PMFS History: Patient Active  Problem List   Diagnosis Date Noted   Poor sleep hygiene 06/30/2022   Snoring 06/30/2022   Poor compliance with CPAP treatment 06/30/2022   Insomnia secondary to chronic pain 02/05/2022   Excessive daytime sleepiness 02/05/2022   History of restless legs syndrome 02/05/2022   Failed back surgical syndrome 02/05/2022   Status post complete thyroidectomy 02/05/2022   Bone lesion 04/30/2021   H/O bilateral breast reduction surgery 12/26/2015   H/O abdominal supracervical subtotal hysterectomy 04/17/2014   Past Medical History:  Diagnosis Date   Arthritis    Chronic pain    Hypertension    Interstitial lung disease (HCC)    Pneumonia    Sciatica     No family history on file.  Past Surgical History:  Procedure Laterality Date   ABDOMINAL HYSTERECTOMY     BACK SURGERY     Social History   Occupational History   Not on file  Tobacco Use   Smoking status: Never    Passive exposure: Never   Smokeless tobacco: Never  Vaping Use   Vaping Use: Never used  Substance and Sexual Activity   Alcohol use: Never   Drug use: Never   Sexual activity: Not on file

## 2022-10-17 ENCOUNTER — Ambulatory Visit (HOSPITAL_BASED_OUTPATIENT_CLINIC_OR_DEPARTMENT_OTHER): Payer: Medicare HMO

## 2022-10-21 ENCOUNTER — Encounter (HOSPITAL_BASED_OUTPATIENT_CLINIC_OR_DEPARTMENT_OTHER): Payer: Self-pay

## 2022-10-21 ENCOUNTER — Ambulatory Visit (HOSPITAL_BASED_OUTPATIENT_CLINIC_OR_DEPARTMENT_OTHER)
Admission: RE | Admit: 2022-10-21 | Discharge: 2022-10-21 | Disposition: A | Discharge status: Home / Self Care | Payer: Medicare HMO | Source: Ambulatory Visit | Attending: Family | Admitting: Family

## 2022-10-21 DIAGNOSIS — Z1231 Encounter for screening mammogram for malignant neoplasm of breast: Secondary | ICD-10-CM | POA: Diagnosis not present

## 2022-10-21 DIAGNOSIS — R102 Pelvic and perineal pain: Secondary | ICD-10-CM | POA: Insufficient documentation

## 2022-10-21 DIAGNOSIS — R1032 Left lower quadrant pain: Secondary | ICD-10-CM | POA: Diagnosis not present

## 2022-10-23 DIAGNOSIS — Z6832 Body mass index (BMI) 32.0-32.9, adult: Secondary | ICD-10-CM | POA: Diagnosis not present

## 2022-10-23 DIAGNOSIS — Z01419 Encounter for gynecological examination (general) (routine) without abnormal findings: Secondary | ICD-10-CM | POA: Diagnosis not present

## 2022-10-23 LAB — HM PAP SMEAR: HPV, high-risk: NOT DETECTED

## 2022-10-27 ENCOUNTER — Ambulatory Visit: Payer: Medicare HMO | Admitting: Podiatry

## 2022-11-20 ENCOUNTER — Ambulatory Visit (INDEPENDENT_AMBULATORY_CARE_PROVIDER_SITE_OTHER): Payer: Medicare HMO | Admitting: *Deleted

## 2022-11-20 DIAGNOSIS — Z0000 Encounter for general adult medical examination without abnormal findings: Secondary | ICD-10-CM | POA: Diagnosis not present

## 2022-11-20 NOTE — Patient Instructions (Signed)
Caitlin White , Thank you for taking time to come for your Medicare Wellness Visit. I appreciate your ongoing commitment to your health goals. Please review the following plan we discussed and let me know if I can assist you in the future.   These are the goals we discussed:  Goals   None     This is a list of the screening recommended for you and due dates:  Health Maintenance  Topic Date Due   HIV Screening  Never done   Hepatitis C Screening  Never done   DTaP/Tdap/Td vaccine (1 - Tdap) Never done   Pap Smear  Never done   Colon Cancer Screening  Never done   Zoster (Shingles) Vaccine (1 of 2) Never done   COVID-19 Vaccine (1 - 2023-24 season) Never done   Flu Shot  12/11/2022   Medicare Annual Wellness Visit  11/20/2023   Mammogram  10/20/2024   HPV Vaccine  Aged Out      Next appointment: Follow up in one year for your annual wellness visit.   Preventive Care 40-64 Years, Female Preventive care refers to lifestyle choices and visits with your health care provider that can promote health and wellness. What does preventive care include? A yearly physical exam. This is also called an annual well check. Dental exams once or twice a year. Routine eye exams. Ask your health care provider how often you should have your eyes checked. Personal lifestyle choices, including: Daily care of your teeth and gums. Regular physical activity. Eating a healthy diet. Avoiding tobacco and drug use. Limiting alcohol use. Practicing safe sex. Taking low-dose aspirin daily starting at age 33. Taking vitamin and mineral supplements as recommended by your health care provider. What happens during an annual well check? The services and screenings done by your health care provider during your annual well check will depend on your age, overall health, lifestyle risk factors, and family history of disease. Counseling  Your health care provider may ask you questions about your: Alcohol  use. Tobacco use. Drug use. Emotional well-being. Home and relationship well-being. Sexual activity. Eating habits. Work and work Astronomer. Method of birth control. Menstrual cycle. Pregnancy history. Screening  You may have the following tests or measurements: Height, weight, and BMI. Blood pressure. Lipid and cholesterol levels. These may be checked every 5 years, or more frequently if you are over 43 years old. Skin check. Lung cancer screening. You may have this screening every year starting at age 70 if you have a 30-pack-year history of smoking and currently smoke or have quit within the past 15 years. Fecal occult blood test (FOBT) of the stool. You may have this test every year starting at age 17. Flexible sigmoidoscopy or colonoscopy. You may have a sigmoidoscopy every 5 years or a colonoscopy every 10 years starting at age 75. Hepatitis C blood test. Hepatitis B blood test. Sexually transmitted disease (STD) testing. Diabetes screening. This is done by checking your blood sugar (glucose) after you have not eaten for a while (fasting). You may have this done every 1-3 years. Mammogram. This may be done every 1-2 years. Talk to your health care provider about when you should start having regular mammograms. This may depend on whether you have a family history of breast cancer. BRCA-related cancer screening. This may be done if you have a family history of breast, ovarian, tubal, or peritoneal cancers. Pelvic exam and Pap test. This may be done every 3 years starting at age 60.  Starting at age 44, this may be done every 5 years if you have a Pap test in combination with an HPV test. Bone density scan. This is done to screen for osteoporosis. You may have this scan if you are at high risk for osteoporosis. Discuss your test results, treatment options, and if necessary, the need for more tests with your health care provider. Vaccines  Your health care provider may recommend  certain vaccines, such as: Influenza vaccine. This is recommended every year. Tetanus, diphtheria, and acellular pertussis (Tdap, Td) vaccine. You may need a Td booster every 10 years. Zoster vaccine. You may need this after age 68. Pneumococcal 13-valent conjugate (PCV13) vaccine. You may need this if you have certain conditions and were not previously vaccinated. Pneumococcal polysaccharide (PPSV23) vaccine. You may need one or two doses if you smoke cigarettes or if you have certain conditions. Talk to your health care provider about which screenings and vaccines you need and how often you need them. This information is not intended to replace advice given to you by your health care provider. Make sure you discuss any questions you have with your health care provider. Document Released: 05/25/2015 Document Revised: 01/16/2016 Document Reviewed: 02/27/2015 Elsevier Interactive Patient Education  2017 [employer].    Fall Prevention in the Home Falls can cause injuries. They can happen to people of all ages. There are many things you can do to make your home safe and to help prevent falls. What can I do on the outside of my home? Regularly fix the edges of walkways and driveways and fix any cracks. Remove anything that might make you trip as you walk through a door, such as a raised step or threshold. Trim any bushes or trees on the path to your home. Use bright outdoor lighting. Clear any walking paths of anything that might make someone trip, such as rocks or tools. Regularly check to see if handrails are loose or broken. Make sure that both sides of any steps have handrails. Any raised decks and porches should have guardrails on the edges. Have any leaves, snow, or ice cleared regularly. Use sand or salt on walking paths during winter. Clean up any spills in your garage right away. This includes oil or grease spills. What can I do in the bathroom? Use night lights. Install grab bars  by the toilet and in the tub and shower. Do not use towel bars as grab bars. Use non-skid mats or decals in the tub or shower. If you need to sit down in the shower, use a plastic, non-slip stool. Keep the floor dry. Clean up any water that spills on the floor as soon as it happens. Remove soap buildup in the tub or shower regularly. Attach bath mats securely with double-sided non-slip rug tape. Do not have throw rugs and other things on the floor that can make you trip. What can I do in the bedroom? Use night lights. Make sure that you have a light by your bed that is easy to reach. Do not use any sheets or blankets that are too big for your bed. They should not hang down onto the floor. Have a firm chair that has side arms. You can use this for support while you get dressed. Do not have throw rugs and other things on the floor that can make you trip. What can I do in the kitchen? Clean up any spills right away. Avoid walking on wet floors. Keep items that you use  a lot in easy-to-reach places. If you need to reach something above you, use a strong step stool that has a grab bar. Keep electrical cords out of the way. Do not use floor polish or wax that makes floors slippery. If you must use wax, use non-skid floor wax. Do not have throw rugs and other things on the floor that can make you trip. What can I do with my stairs? Do not leave any items on the stairs. Make sure that there are handrails on both sides of the stairs and use them. Fix handrails that are broken or loose. Make sure that handrails are as long as the stairways. Check any carpeting to make sure that it is firmly attached to the stairs. Fix any carpet that is loose or worn. Avoid having throw rugs at the top or bottom of the stairs. If you do have throw rugs, attach them to the floor with carpet tape. Make sure that you have a light switch at the top of the stairs and the bottom of the stairs. If you do not have them, ask  someone to add them for you. What else can I do to help prevent falls? Wear shoes that: Do not have high heels. Have rubber bottoms. Are comfortable and fit you well. Are closed at the toe. Do not wear sandals. If you use a stepladder: Make sure that it is fully opened. Do not climb a closed stepladder. Make sure that both sides of the stepladder are locked into place. Ask someone to hold it for you, if possible. Clearly mark and make sure that you can see: Any grab bars or handrails. First and last steps. Where the edge of each step is. Use tools that help you move around (mobility aids) if they are needed. These include: Canes. Walkers. Scooters. Crutches. Turn on the lights when you go into a dark area. Replace any light bulbs as soon as they burn out. Set up your furniture so you have a clear path. Avoid moving your furniture around. If any of your floors are uneven, fix them. If there are any pets around you, be aware of where they are. Review your medicines with your doctor. Some medicines can make you feel dizzy. This can increase your chance of falling. Ask your doctor what other things that you can do to help prevent falls. This information is not intended to replace advice given to you by your health care provider. Make sure you discuss any questions you have with your health care provider. Document Released: 02/22/2009 Document Revised: 10/04/2015 Document Reviewed: 06/02/2014 Elsevier Interactive Patient Education  2017 [employer].

## 2022-11-20 NOTE — Progress Notes (Signed)
Subjective:   Caitlin White is a 57 y.o. female who presents for Medicare Annual (Subsequent) preventive examination.  Visit Complete: Virtual  I connected with  Maurilio Lovely on 11/20/22 by a audio enabled telemedicine application and verified that I am speaking with the correct person using two identifiers.  Patient Location: Home  Provider Location: Office/Clinic  I discussed the limitations of evaluation and management by telemedicine. The patient expressed understanding and agreed to proceed.   Review of Systems     Cardiac Risk Factors include: hypertension     Objective:    Today's Vitals   There is no height or weight on file to calculate BMI.     11/20/2022    3:00 PM 09/26/2022    8:37 AM 10/15/2021    7:27 PM 05/23/2021    9:54 AM 04/26/2021    1:09 PM 02/24/2021    5:09 PM 01/16/2021   11:50 AM  Advanced Directives  Does Patient Have a Medical Advance Directive? No No No No No No No  Would patient like information on creating a medical advance directive? No - Patient declined No - Patient declined  No - Patient declined  No - Patient declined     Current Medications (verified) Outpatient Encounter Medications as of 11/20/2022  Medication Sig   levothyroxine (SYNTHROID) 200 MCG tablet    losartan-hydrochlorothiazide (HYZAAR) 100-12.5 MG tablet    omeprazole (PRILOSEC) 20 MG capsule Take 20 mg by mouth daily.   potassium chloride (KLOR-CON M) 10 MEQ tablet Take by mouth.   tiZANidine (ZANAFLEX) 4 MG tablet Take 1 tablet (4 mg total) by mouth every 8 (eight) hours as needed for muscle spasms.   VITAMIN D PO Take by mouth.   No facility-administered encounter medications on file as of 11/20/2022.    Allergies (verified) Patient has no known allergies.   History: Past Medical History:  Diagnosis Date   Arthritis    Chronic pain    Hypertension    Interstitial lung disease (HCC)    Pneumonia    Sciatica    Past Surgical History:  Procedure Laterality  Date   ABDOMINAL HYSTERECTOMY     BACK SURGERY     History reviewed. No pertinent family history. Social History   Socioeconomic History   Marital status: Married    Spouse name: Not on file   Number of children: Not on file   Years of education: Not on file   Highest education level: Not on file  Occupational History   Not on file  Tobacco Use   Smoking status: Never    Passive exposure: Never   Smokeless tobacco: Never  Vaping Use   Vaping status: Never Used  Substance and Sexual Activity   Alcohol use: Never   Drug use: Never   Sexual activity: Not on file  Other Topics Concern   Not on file  Social History Narrative   Not on file   Social Determinants of Health   Financial Resource Strain: Low Risk  (11/20/2022)   Overall Financial Resource Strain (CARDIA)    Difficulty of Paying Living Expenses: Not hard at all  Food Insecurity: No Food Insecurity (11/20/2022)   Hunger Vital Sign    Worried About Running Out of Food in the Last Year: Never true    Ran Out of Food in the Last Year: Never true  Transportation Needs: No Transportation Needs (11/20/2022)   PRAPARE - Administrator, Civil Service (Medical): No  Lack of Transportation (Non-Medical): No  Physical Activity: Inactive (11/20/2022)   Exercise Vital Sign    Days of Exercise per Week: 0 days    Minutes of Exercise per Session: 0 min  Stress: No Stress Concern Present (11/20/2022)   [employer] of Occupational Health - Occupational Stress Questionnaire    Feeling of Stress : Not at all  Social Connections: Moderately Isolated (11/20/2022)   Social Connection and Isolation Panel [NHANES]    Frequency of Communication with Friends and Family: More than three times a week    Frequency of Social Gatherings with Friends and Family: Once a week    Attends Religious Services: Never    Database administrator or Organizations: No    Attends Engineer, structural: Never    Marital Status:  Married    Tobacco Counseling Counseling given: Not Answered   Clinical Intake:  Pre-visit preparation completed: Yes  Pain : No/denies pain  Nutritional Risks: None Diabetes: No  How often do you need to have someone help you when you read instructions, pamphlets, or other written materials from your doctor or pharmacy?: 1 - Never  Interpreter Needed?: No  Information entered by :: [employer], CMA   Activities of Daily Living    11/20/2022    2:55 PM  In your present state of health, do you have any difficulty performing the following activities:  Hearing? 0  Vision? 0  Difficulty concentrating or making decisions? 0  Walking or climbing stairs? 0  Dressing or bathing? 0  Doing errands, shopping? 0  Preparing Food and eating ? N  Using the Toilet? N  In the past six months, have you accidently leaked urine? N  Do you have problems with loss of bowel control? N  Managing your Medications? N  Managing your Finances? N  Housekeeping or managing your Housekeeping? N    Patient Care Team: Olive Bass, FNP as PCP - General (Internal Medicine)  Indicate any recent Medical Services you may have received from other than [facility] providers in the past year (date may be approximate).     Assessment:   This is a routine wellness examination for [place].  Hearing/Vision screen No results found.  Dietary issues and exercise activities discussed:     Goals Addressed   None    Depression Screen    11/20/2022    3:04 PM 10/07/2022   10:51 AM  PHQ 2/9 Scores  PHQ - 2 Score 0 0    Fall Risk    11/20/2022    2:58 PM 10/07/2022   10:51 AM  Fall Risk   Falls in the past year? 1 1  Number falls in past yr: 0 0  Injury with Fall? 0 0  Risk for fall due to : No Fall Risks History of fall(s)  Risk for fall due to: Comment had a dizzy spell   Follow up Falls evaluation completed Falls evaluation completed    MEDICARE RISK AT HOME:  Medicare Risk at Home -  11/20/22 1457     Any stairs in or around the home? Yes    If so, are there any without handrails? No    Home free of loose throw rugs in walkways, pet beds, electrical cords, etc? Yes    Adequate lighting in your home to reduce risk of falls? Yes    Life alert? No    Use of a cane, walker or w/c? No    Grab bars in the  bathroom? No    Shower chair or bench in shower? No    Elevated toilet seat or a handicapped toilet? No             TIMED UP AND GO:  Was the test performed?  No    Cognitive Function:        11/20/2022    3:06 PM  6CIT Screen  What Year? 0 points  What month? 0 points  What time? 0 points  Count back from 20 0 points  Months in reverse 0 points  Repeat phrase 6 points  Total Score 6 points    Immunizations Immunization History  Administered Date(s) Administered   Influenza,inj,quad, With Preservative 01/25/2018, 02/03/2019   Influenza-Unspecified 03/12/2021    TDAP status: Due, Education has been provided regarding the importance of this vaccine. Advised may receive this vaccine at local pharmacy or Health Dept. Aware to provide a copy of the vaccination record if obtained from local pharmacy or Health Dept. Verbalized acceptance and understanding.  Flu Vaccine status: Up to date  Pneumococcal vaccine status: Due, Education has been provided regarding the importance of this vaccine. Advised may receive this vaccine at local pharmacy or Health Dept. Aware to provide a copy of the vaccination record if obtained from local pharmacy or Health Dept. Verbalized acceptance and understanding.  Covid-19 vaccine status: Information provided on how to obtain vaccines.   Qualifies for Shingles Vaccine? Yes   Zostavax completed No   Shingrix Completed?: No.    Education has been provided regarding the importance of this vaccine. Patient has been advised to call insurance company to determine out of pocket expense if they have not yet received this vaccine.  Advised may also receive vaccine at local pharmacy or Health Dept. Verbalized acceptance and understanding.  Screening Tests Health Maintenance  Topic Date Due   Medicare Annual Wellness (AWV)  Never done   HIV Screening  Never done   Hepatitis C Screening  Never done   DTaP/Tdap/Td (1 - Tdap) Never done   PAP SMEAR-Modifier  Never done   Colonoscopy  Never done   Zoster Vaccines- Shingrix (1 of 2) Never done   COVID-19 Vaccine (1 - 2023-24 season) Never done   INFLUENZA VACCINE  12/11/2022   MAMMOGRAM  10/20/2024   HPV VACCINES  Aged Out    Health Maintenance  Health Maintenance Due  Topic Date Due   Medicare Annual Wellness (AWV)  Never done   HIV Screening  Never done   Hepatitis C Screening  Never done   DTaP/Tdap/Td (1 - Tdap) Never done   PAP SMEAR-Modifier  Never done   Colonoscopy  Never done   Zoster Vaccines- Shingrix (1 of 2) Never done   COVID-19 Vaccine (1 - 2023-24 season) Never done    Colorectal cancer screening: Type of screening: Colonoscopy. Completed 2022 pt reported was completed in [place]. Repeat every 7 years  Mammogram status: Completed 10/21/22. Repeat every year  Bone Density status: not due until age 45  Lung Cancer Screening: (Low Dose CT Chest recommended if Age 77-80 years, 20 pack-year currently smoking OR have quit w/in 15years.) does not qualify.   Additional Screening:  Hepatitis C Screening: does qualify; Completed N/a  Vision Screening: Recommended annual ophthalmology exams for early detection of glaucoma and other disorders of the eye. Is the patient up to date with their annual eye exam?  Yes  Who is the provider or what is the name of the office in which the  patient attends annual eye exams? [facility] If pt is not established with a provider, would they like to be referred to a provider to establish care? No .   Dental Screening: Recommended annual dental exams for proper oral hygiene  Diabetic Foot Exam: N/a  Community  Resource Referral / Chronic Care Management: CRR required this visit?  No   CCM required this visit?  No     Plan:     I have personally reviewed and noted the following in the patient's chart:   Medical and social history Use of alcohol, tobacco or illicit drugs  Current medications and supplements including opioid prescriptions. Patient is not currently taking opioid prescriptions. Functional ability and status Nutritional status Physical activity Advanced directives List of other physicians Hospitalizations, surgeries, and ER visits in previous 12 months Vitals Screenings to include cognitive, depression, and falls Referrals and appointments  In addition, I have reviewed and discussed with patient certain preventive protocols, quality metrics, and best practice recommendations. A written personalized care plan for preventive services as well as general preventive health recommendations were provided to patient.     Donne Anon, CMA   11/20/2022   After Visit Summary: (MyChart) Due to this being a telephonic visit, the after visit summary with patients personalized plan was offered to patient via MyChart   Nurse Notes: None

## 2022-11-25 ENCOUNTER — Ambulatory Visit: Payer: Medicare HMO | Admitting: Pulmonary Disease

## 2023-01-07 DIAGNOSIS — H52209 Unspecified astigmatism, unspecified eye: Secondary | ICD-10-CM | POA: Diagnosis not present

## 2023-01-07 DIAGNOSIS — H5203 Hypermetropia, bilateral: Secondary | ICD-10-CM | POA: Diagnosis not present

## 2023-01-07 DIAGNOSIS — R21 Rash and other nonspecific skin eruption: Secondary | ICD-10-CM | POA: Diagnosis not present

## 2023-01-07 DIAGNOSIS — H524 Presbyopia: Secondary | ICD-10-CM | POA: Diagnosis not present

## 2023-01-07 DIAGNOSIS — L403 Pustulosis palmaris et plantaris: Secondary | ICD-10-CM | POA: Diagnosis not present

## 2023-01-15 ENCOUNTER — Telehealth: Payer: Self-pay | Admitting: Family

## 2023-01-15 NOTE — Telephone Encounter (Signed)
Spoke with pharmacy staff and last refill was 08/28/2022.

## 2023-01-15 NOTE — Telephone Encounter (Signed)
Prescription Request  01/15/2023  Is this a "Controlled Substance" medicine? No  LOV: 10/07/2022  What is the name of the medication or equipment? potassium chloride (KLOR-CON M) 10 MEQ tablet   Have you contacted your pharmacy to request a refill? No   Which pharmacy would you like this sent to?   [id number] - [place], [place] - [address] [place] [address] Phone: [phone] Fax: [phone]    Patient notified that their request is being sent to the clinical staff for review and that they should receive a response within 2 business days.   Please advise at [facility] [phone]

## 2023-01-16 ENCOUNTER — Telehealth: Payer: Self-pay | Admitting: Family

## 2023-01-16 ENCOUNTER — Ambulatory Visit (INDEPENDENT_AMBULATORY_CARE_PROVIDER_SITE_OTHER): Payer: Medicare HMO | Admitting: Physician Assistant

## 2023-01-16 ENCOUNTER — Other Ambulatory Visit: Payer: Self-pay | Admitting: Family

## 2023-01-16 ENCOUNTER — Encounter: Payer: Self-pay | Admitting: Physician Assistant

## 2023-01-16 VITALS — BP 140/82 | HR 81 | Temp 98.0°F | Wt 250.0 lb

## 2023-01-16 DIAGNOSIS — G8929 Other chronic pain: Secondary | ICD-10-CM

## 2023-01-16 DIAGNOSIS — L723 Sebaceous cyst: Secondary | ICD-10-CM | POA: Diagnosis not present

## 2023-01-16 MED ORDER — DOXYCYCLINE HYCLATE 100 MG PO TABS
100.0000 mg | ORAL_TABLET | Freq: Two times a day (BID) | ORAL | 0 refills | Status: AC
Start: 2023-01-16 — End: 2023-01-23

## 2023-01-16 MED ORDER — CELECOXIB 100 MG PO CAPS
100.0000 mg | ORAL_CAPSULE | Freq: Two times a day (BID) | ORAL | 0 refills | Status: DC
Start: 2023-01-16 — End: 2023-01-23

## 2023-01-16 MED ORDER — POTASSIUM CHLORIDE CRYS ER 10 MEQ PO TBCR: 10.0000 meq | EXTENDED_RELEASE_TABLET | Freq: Every day | ORAL | 0 refills | Status: DC

## 2023-01-16 NOTE — Telephone Encounter (Signed)
Pt stopped by front desk on the way out to ask about the referral to the neurology. Advised her the provider has not finished documenting the visit so the referral has not been placed but that I will send a message back.

## 2023-01-16 NOTE — Progress Notes (Signed)
Established patient visit   Patient: Caitlin White   DOB: 1966-02-25   57 y.o. Female  MRN: [id number] Visit Date: 01/16/2023  Today's healthcare provider: Alfredia Ferguson, PA-C   Chief Complaint  Patient presents with   Lump in back    Lump in back and noticed the night before last. Requesting referral to neurology   Subjective    HPI Pt reports a lump on the middle of her back that she noticed two days ago, that is painful. Taking tylenol over the counter.  Pt also discusses her ongoing chronic pain everywhere, restless leg, neuropathy. States she is following with a pain management doctor in [place] for a workers comp issue but as she does not seem them frequently she is often out of medication. Reports getting 'celebrex, cymbalta and prescription tylenol' from them. Reports having an appointment last week and she is waiting for the pharmacy to send these meds.  She asks several times for [place] to prescribe these meds until she can get them from the pharmacy. She does not know the doses of the medications she is taking.  Medications: Outpatient Medications Prior to Visit  Medication Sig   levothyroxine (SYNTHROID) 200 MCG tablet    losartan-hydrochlorothiazide (HYZAAR) 100-12.5 MG tablet    omeprazole (PRILOSEC) 20 MG capsule Take 20 mg by mouth daily.   potassium chloride (KLOR-CON M) 10 MEQ tablet Take by mouth.   tiZANidine (ZANAFLEX) 4 MG tablet Take 1 tablet (4 mg total) by mouth every 8 (eight) hours as needed for muscle spasms.   VITAMIN D PO Take by mouth.   No facility-administered medications prior to visit.    Review of Systems  Constitutional:  Negative for fatigue and fever.  Respiratory:  Negative for cough and shortness of breath.   Cardiovascular:  Negative for chest pain and leg swelling.  Gastrointestinal:  Negative for abdominal pain.  Skin:        Lump on back  Neurological:  Negative for dizziness and headaches.     Objective    BP (!) 140/82 (BP  Location: Left Arm, Patient Position: Sitting, Cuff Size: Normal)   Pulse 81   Temp 98 F (36.7 C)   Wt 250 lb (113.4 kg)   SpO2 99%   BMI 32.98 kg/m   Physical Exam Vitals reviewed.  Constitutional:      Appearance: She is not ill-appearing.  HENT:     Head: Normocephalic.  Eyes:     Conjunctiva/sclera: Conjunctivae normal.  Cardiovascular:     Rate and Rhythm: Normal rate.  Pulmonary:     Effort: Pulmonary effort is normal. No respiratory distress.  Skin:    Comments: Left side of thoracic spine there is a 3-4 cm firm slightly erythematous mass w/ a punctum seen.  Neurological:     General: No focal deficit present.     Mental Status: She is alert and oriented to person, place, and time.  Psychiatric:        Mood and Affect: Mood normal.        Behavior: Behavior normal.      No results found for any visits on 01/16/23.  Assessment & Plan     1. Sebaceous cyst, inflamed Advised warm compresses  Not appropriate for I&D just yet Due to size, rx doxycyline bid x 7 days  - doxycycline (VIBRA-TABS) 100 MG tablet; Take 1 tablet (100 mg total) by mouth 2 (two) times daily for 7 days.  Dispense: 14 tablet;  Refill: 0  2. Other chronic pain Advised pt we cannot take over pain management from another provider. We cannot provide dual prescriptions. Pt reports her prescriptions are delayed from the pharmacy, but that the celebrex, prescription tylenol, and cymbalta are chronic meds.  Provided pt with one week of celebrex.  - celecoxib (CELEBREX) 100 MG capsule; Take 1 capsule (100 mg total) by mouth 2 (two) times daily for 7 days.  Dispense: 14 capsule; Refill: 0   Return if symptoms worsen or fail to improve.      I, Alfredia Ferguson, PA-C have reviewed all documentation for this visit. The documentation on  01/16/23   for the exam, diagnosis, procedures, and orders are all accurate and complete.    Alfredia Ferguson, PA-C  [facility] [phone] (phone) [phone] (fax)  [facility]

## 2023-01-16 NOTE — Patient Instructions (Signed)
Neurology- [phone]

## 2023-01-16 NOTE — Telephone Encounter (Signed)
Pts states she has been on the medication for "awhile" and just took her last one. Pt states the Rx is usually filled every 3 months and would like a refill sent in.

## 2023-01-23 ENCOUNTER — Ambulatory Visit: Payer: Medicare HMO | Admitting: Physician Assistant

## 2023-01-23 ENCOUNTER — Ambulatory Visit (INDEPENDENT_AMBULATORY_CARE_PROVIDER_SITE_OTHER): Payer: Medicare HMO | Admitting: Family

## 2023-01-23 ENCOUNTER — Ambulatory Visit: Payer: Medicare HMO | Admitting: Family

## 2023-01-23 ENCOUNTER — Ambulatory Visit (HOSPITAL_BASED_OUTPATIENT_CLINIC_OR_DEPARTMENT_OTHER)
Admission: RE | Admit: 2023-01-23 | Discharge: 2023-01-23 | Disposition: A | Discharge status: Home / Self Care | Payer: Medicare HMO | Source: Ambulatory Visit | Attending: Family | Admitting: Family

## 2023-01-23 ENCOUNTER — Encounter: Payer: Self-pay | Admitting: Family

## 2023-01-23 ENCOUNTER — Telehealth: Payer: Self-pay | Admitting: Family

## 2023-01-23 VITALS — BP 124/82 | HR 75 | Resp 18 | Ht 73.0 in | Wt 251.8 lb

## 2023-01-23 DIAGNOSIS — L723 Sebaceous cyst: Secondary | ICD-10-CM | POA: Diagnosis not present

## 2023-01-23 DIAGNOSIS — G8929 Other chronic pain: Secondary | ICD-10-CM | POA: Diagnosis not present

## 2023-01-23 DIAGNOSIS — R079 Chest pain, unspecified: Secondary | ICD-10-CM | POA: Diagnosis not present

## 2023-01-23 DIAGNOSIS — R9389 Abnormal findings on diagnostic imaging of other specified body structures: Secondary | ICD-10-CM | POA: Diagnosis not present

## 2023-01-23 DIAGNOSIS — E039 Hypothyroidism, unspecified: Secondary | ICD-10-CM | POA: Diagnosis not present

## 2023-01-23 LAB — COMPREHENSIVE METABOLIC PANEL
ALT: 13 U/L (ref 0–35)
AST: 16 U/L (ref 0–37)
Albumin: 3.6 g/dL (ref 3.5–5.2)
Alkaline Phosphatase: 71 U/L (ref 39–117)
BUN: 13 mg/dL (ref 6–23)
CO2: 30 meq/L (ref 19–32)
Calcium: 9 mg/dL (ref 8.4–10.5)
Chloride: 104 meq/L (ref 96–112)
Creatinine, Ser: 0.64 mg/dL (ref 0.40–1.20)
GFR: 99.33 mL/min (ref >60.00)
Glucose, Bld: 89 mg/dL (ref 70–99)
Potassium: 3.5 meq/L (ref 3.5–5.1)
Sodium: 142 meq/L (ref 135–145)
Total Bilirubin: 0.6 mg/dL (ref 0.2–1.2)
Total Protein: 7.2 g/dL (ref 6.0–8.3)

## 2023-01-23 LAB — CBC WITH DIFFERENTIAL/PLATELET
Basophils Absolute: 0 10*3/uL (ref 0.0–0.1)
Basophils Relative: 1.2 % (ref 0.0–3.0)
Eosinophils Absolute: 0.1 10*3/uL (ref 0.0–0.7)
Eosinophils Relative: 2.1 % (ref 0.0–5.0)
HCT: 35.8 % — ABNORMAL LOW (ref 36.0–46.0)
Hemoglobin: 11.8 g/dL — ABNORMAL LOW (ref 12.0–15.0)
Lymphocytes Relative: 43.1 % (ref 12.0–46.0)
Lymphs Abs: 1.8 10*3/uL (ref 0.7–4.0)
MCHC: 33 g/dL (ref 30.0–36.0)
MCV: 95.2 fl (ref 78.0–100.0)
Monocytes Absolute: 0.3 10*3/uL (ref 0.1–1.0)
Monocytes Relative: 7.5 % (ref 3.0–12.0)
Neutro Abs: 1.9 10*3/uL (ref 1.4–7.7)
Neutrophils Relative %: 46.1 % (ref 43.0–77.0)
Platelets: 293 10*3/uL (ref 150.0–400.0)
RBC: 3.76 Mil/uL — ABNORMAL LOW (ref 3.87–5.11)
RDW: 13.1 % (ref 11.5–15.5)
WBC: 4.1 10*3/uL (ref 4.0–10.5)

## 2023-01-23 LAB — TSH: TSH: 2.04 u[IU]/mL (ref 0.35–5.50)

## 2023-01-23 MED ORDER — CELECOXIB 100 MG PO CAPS: 100.0000 mg | ORAL_CAPSULE | Freq: Two times a day (BID) | ORAL | 0 refills | Status: AC

## 2023-01-23 MED ORDER — POTASSIUM CHLORIDE CRYS ER 10 MEQ PO TBCR: 10.0000 meq | EXTENDED_RELEASE_TABLET | Freq: Every day | ORAL | 6 refills | Status: DC

## 2023-01-23 NOTE — Telephone Encounter (Signed)
Pt called stating that she had forgotten to ask for Vernona Rieger to put in a referral for her heartburn through GI. Please Advise.

## 2023-01-23 NOTE — Patient Instructions (Signed)
Neurology: [phone]

## 2023-01-23 NOTE — Progress Notes (Signed)
Caitlin White is a 57 y.o. female with the following history as recorded in Epic[employer]:  Patient Active Problem List   Diagnosis Date Noted   Poor sleep hygiene 06/30/2022   Snoring 06/30/2022   Poor compliance with CPAP treatment 06/30/2022   Insomnia secondary to chronic pain 02/05/2022   Excessive daytime sleepiness 02/05/2022   History of restless legs syndrome 02/05/2022   Failed back surgical syndrome 02/05/2022   Status post complete thyroidectomy 02/05/2022   Bone lesion 04/30/2021   H/O bilateral breast reduction surgery 12/26/2015   H/O abdominal supracervical subtotal hysterectomy 04/17/2014    Current Outpatient Medications  Medication Sig Dispense Refill   doxycycline (VIBRA-TABS) 100 MG tablet Take 1 tablet (100 mg total) by mouth 2 (two) times daily for 7 days. 14 tablet 0   levothyroxine (SYNTHROID) 200 MCG tablet      losartan-hydrochlorothiazide (HYZAAR) 100-12.5 MG tablet      omeprazole (PRILOSEC) 20 MG capsule Take 20 mg by mouth daily.     tiZANidine (ZANAFLEX) 4 MG tablet Take 1 tablet (4 mg total) by mouth every 8 (eight) hours as needed for muscle spasms. 10 tablet 0   VITAMIN D PO Take by mouth.     celecoxib (CELEBREX) 100 MG capsule Take 1 capsule (100 mg total) by mouth 2 (two) times daily for 7 days. 14 capsule 0   potassium chloride (KLOR-CON M) 10 MEQ tablet Take 1 tablet (10 mEq total) by mouth daily. 30 tablet 6   No current facility-administered medications for this visit.    Allergies: Patient has no known allergies.  Past Medical History:  Diagnosis Date   Arthritis    Chronic pain    Hypertension    Interstitial lung disease (HCC)    Pneumonia    Sciatica     Past Surgical History:  Procedure Laterality Date   ABDOMINAL HYSTERECTOMY     BACK SURGERY      No family history on file.  Social History   Tobacco Use   Smoking status: Never    Passive exposure: Never   Smokeless tobacco: Never  Substance Use Topics   Alcohol use: Never     Subjective:   Follow up on chronic care needs- Initially scheduled appointment indicating she needed to see a urologist; upon further discussion, she indicates she was asking about seeing hematology/ oncology due to lesions on her bones; records indicate that she had PET scan/ bone biopsy in January 2023 and per hematology notes, no further follow up was recommended at that time.  She again mentions abnormal chest pain- CXR from 09/2022 was reviewed at [facility] in May 2024 and agreed that she would see her pulmonologist in follow up; she did not follow up as requested; notes that symptoms continuing and just having vague/ "random" pain through her right chest/ right shoulder; symptoms have been present for 5-6 months- denies chest pain on exertion;   Requesting short term refill on Celebrex until her pain management provider in [place] can updated Rx for her;   She would also like referral to surgeon regarding sebaceous cyst noted on her back and treated last week; does think Doxycycline has helped;   Objective:  Vitals:   01/23/23 0834  BP: 124/82  Pulse: 75  Resp: 18  SpO2: 98%  Weight: 251 lb 12.8 oz (114.2 kg)  Height: 6\' 1"  (1.854 m)    General: Well developed, well nourished, in no acute distress  Skin : Warm and dry.  Head: Normocephalic and  atraumatic  Eyes: Sclera and conjunctiva clear; pupils round and reactive to light; extraocular movements intact  Ears: External normal; canals clear; tympanic membranes normal  Oropharynx: Pink, supple. No suspicious lesions  Neck: Supple without thyromegaly, adenopathy  Lungs: Respirations unlabored; clear to auscultation bilaterally without wheeze, rales, rhonchi  CVS exam: normal rate and regular rhythm.  Neurologic: Alert and oriented; speech intact; face symmetrical; moves all extremities well; CNII-XII intact without focal deficit   Assessment:  1. Abnormal CXR   2. Hypothyroidism, unspecified type   3. Other chronic pain   4. Sebaceous  cyst     Plan:  Repeat CXR today- STAT; will also update D-dimer; Rx updated for chest CT as well; she is again encouraged to call her pulmonologist and given that number to schedule since that office has asked that current patients call to schedule rather than sending another referral; to consider referral back to oncologist as well;  Check TSH today; continue same medications;  Short term Rx for Celebrex x 1 week; again reminded patient that will not be able to take over pain management needs for her since she has a provider in [place]; Referral to general surgery;   No follow-ups on file.  Orders Placed This Encounter  Procedures   CT Chest Wo Contrast    Standing Status:   Future    Standing Expiration Date:   01/23/2024    Order Specific Question:   Is patient pregnant?    Answer:   No    Order Specific Question:   Preferred imaging location?    Answer:   [facility]   DG Chest 2 View    Order Specific Question:   Reason for Exam (SYMPTOM  OR DIAGNOSIS REQUIRED)    Answer:   abnormal cxr    Order Specific Question:   Is patient pregnant?    Answer:   No    Order Specific Question:   Preferred imaging location?    Answer:   [facility]   CBC with Differential/Platelet   Comp Met (CMET)   TSH   D-dimer, quantitative   Ambulatory referral to General Surgery    Referral Priority:   Routine    Referral Type:   Surgical    Referral Reason:   Specialty Services Required    Requested Specialty:   General Surgery    Number of Visits Requested:   1    Requested Prescriptions   Signed Prescriptions Disp Refills   celecoxib (CELEBREX) 100 MG capsule 14 capsule 0    Sig: Take 1 capsule (100 mg total) by mouth 2 (two) times daily for 7 days.   potassium chloride (KLOR-CON M) 10 MEQ tablet 30 tablet 6    Sig: Take 1 tablet (10 mEq total) by mouth daily.

## 2023-01-24 ENCOUNTER — Other Ambulatory Visit: Payer: Self-pay | Admitting: Family

## 2023-01-24 LAB — D-DIMER, QUANTITATIVE: D-Dimer, Quant: 0.9 ug{FEU}/mL — ABNORMAL HIGH (ref <0.50)

## 2023-01-26 ENCOUNTER — Ambulatory Visit
Admission: RE | Admit: 2023-01-26 | Discharge: 2023-01-26 | Disposition: A | Discharge status: Home / Self Care | Payer: Medicare HMO | Source: Ambulatory Visit | Attending: Family | Admitting: Family

## 2023-01-26 ENCOUNTER — Other Ambulatory Visit: Payer: Self-pay | Admitting: Family

## 2023-01-26 DIAGNOSIS — R0789 Other chest pain: Secondary | ICD-10-CM | POA: Diagnosis not present

## 2023-01-26 DIAGNOSIS — R7989 Other specified abnormal findings of blood chemistry: Secondary | ICD-10-CM | POA: Diagnosis not present

## 2023-01-26 DIAGNOSIS — K219 Gastro-esophageal reflux disease without esophagitis: Secondary | ICD-10-CM

## 2023-01-26 MED ORDER — IOPAMIDOL (ISOVUE-370) INJECTION 76%
100.0000 mL | Freq: Once | INTRAVENOUS | Status: AC | PRN
  Administered 2023-01-26: 75 mL via INTRAVENOUS

## 2023-01-26 NOTE — Telephone Encounter (Signed)
Spoke with pt, pt is aware and expressed understanding.

## 2023-01-26 NOTE — Telephone Encounter (Signed)
Pt called to follow up on referral request. Advised message had been routed to [place] and we were waiting on a response from her. Advised we would give the pt a call once we had more info.

## 2023-01-27 ENCOUNTER — Other Ambulatory Visit: Payer: Self-pay | Admitting: Family

## 2023-01-27 MED ORDER — DOXYCYCLINE HYCLATE 100 MG PO TABS: 100.0000 mg | ORAL_TABLET | Freq: Two times a day (BID) | ORAL | 0 refills | Status: DC

## 2023-01-29 ENCOUNTER — Ambulatory Visit (HOSPITAL_BASED_OUTPATIENT_CLINIC_OR_DEPARTMENT_OTHER): Payer: Medicare HMO

## 2023-02-03 DIAGNOSIS — L720 Epidermal cyst: Secondary | ICD-10-CM | POA: Diagnosis not present

## 2023-02-03 DIAGNOSIS — K644 Residual hemorrhoidal skin tags: Secondary | ICD-10-CM | POA: Diagnosis not present

## 2023-02-11 ENCOUNTER — Telehealth: Payer: Self-pay | Admitting: Family

## 2023-02-11 NOTE — Telephone Encounter (Signed)
Pt called stating that no one had called her to go over her last labs with her and would like to go over those as her iron is low. After reviewing chart noted that these were discussed with pt on 9.16.24. Pt also wanted to talk about getting an iron infusion to correct this. Advised pt a message would be sent back to look into this.

## 2023-02-12 ENCOUNTER — Other Ambulatory Visit: Payer: Self-pay | Admitting: Family

## 2023-02-12 DIAGNOSIS — D649 Anemia, unspecified: Secondary | ICD-10-CM

## 2023-02-13 ENCOUNTER — Other Ambulatory Visit: Payer: Self-pay | Admitting: Family

## 2023-02-13 DIAGNOSIS — G629 Polyneuropathy, unspecified: Secondary | ICD-10-CM

## 2023-02-13 DIAGNOSIS — G2581 Restless legs syndrome: Secondary | ICD-10-CM

## 2023-02-13 NOTE — Telephone Encounter (Signed)
Spoke with pt, pt is aware of results and expressed understanding. Pt has an appointment scheduled with hematology 03/19/2023. Pt is also requesting a referral to be placed to follow up on her "neuropathy" she states it has never been addressed in the past and she believes it may be the reason behind her cold feet and restless legs at night.

## 2023-03-19 ENCOUNTER — Inpatient Hospital Stay: Payer: Medicare HMO | Attending: Hematology & Oncology

## 2023-03-19 ENCOUNTER — Inpatient Hospital Stay (HOSPITAL_BASED_OUTPATIENT_CLINIC_OR_DEPARTMENT_OTHER): Payer: Medicare HMO | Admitting: Hematology & Oncology

## 2023-03-19 ENCOUNTER — Encounter: Payer: Self-pay | Admitting: Hematology & Oncology

## 2023-03-19 VITALS — BP 141/78 | HR 79 | Temp 98.8°F | Resp 20 | Ht 73.0 in | Wt 259.1 lb

## 2023-03-19 DIAGNOSIS — Z79899 Other long term (current) drug therapy: Secondary | ICD-10-CM | POA: Diagnosis not present

## 2023-03-19 DIAGNOSIS — G629 Polyneuropathy, unspecified: Secondary | ICD-10-CM | POA: Insufficient documentation

## 2023-03-19 DIAGNOSIS — D649 Anemia, unspecified: Secondary | ICD-10-CM | POA: Insufficient documentation

## 2023-03-19 DIAGNOSIS — K909 Intestinal malabsorption, unspecified: Secondary | ICD-10-CM | POA: Diagnosis not present

## 2023-03-19 DIAGNOSIS — I10 Essential (primary) hypertension: Secondary | ICD-10-CM

## 2023-03-19 DIAGNOSIS — D530 Protein deficiency anemia: Secondary | ICD-10-CM

## 2023-03-19 LAB — CBC WITH DIFFERENTIAL (CANCER CENTER ONLY)
Abs Immature Granulocytes: 0.01 10*3/uL (ref 0.00–0.07)
Basophils Absolute: 0 10*3/uL (ref 0.0–0.1)
Basophils Relative: 1 %
Eosinophils Absolute: 0.1 10*3/uL (ref 0.0–0.5)
Eosinophils Relative: 2 %
HCT: 34.5 % — ABNORMAL LOW (ref 36.0–46.0)
Hemoglobin: 11.8 g/dL — ABNORMAL LOW (ref 12.0–15.0)
Immature Granulocytes: 0 %
Lymphocytes Relative: 41 %
Lymphs Abs: 2.3 10*3/uL (ref 0.7–4.0)
MCH: 31.5 pg (ref 26.0–34.0)
MCHC: 34.2 g/dL (ref 30.0–36.0)
MCV: 92 fL (ref 80.0–100.0)
Monocytes Absolute: 0.2 10*3/uL (ref 0.1–1.0)
Monocytes Relative: 4 %
Neutro Abs: 3 10*3/uL (ref 1.7–7.7)
Neutrophils Relative %: 52 %
Platelet Count: 267 10*3/uL (ref 150–400)
RBC: 3.75 MIL/uL — ABNORMAL LOW (ref 3.87–5.11)
RDW: 13 % (ref 11.5–15.5)
WBC Count: 5.7 10*3/uL (ref 4.0–10.5)
nRBC: 0 % (ref 0.0–0.2)

## 2023-03-19 LAB — SAVE SMEAR(SSMR), FOR PROVIDER SLIDE REVIEW

## 2023-03-19 LAB — CMP (CANCER CENTER ONLY)
ALT: 14 U/L (ref 0–44)
AST: 17 U/L (ref 15–41)
Albumin: 4 g/dL (ref 3.5–5.0)
Alkaline Phosphatase: 59 U/L (ref 38–126)
Anion gap: 7 (ref 5–15)
BUN: 16 mg/dL (ref 6–20)
CO2: 29 mmol/L (ref 22–32)
Calcium: 9.1 mg/dL (ref 8.9–10.3)
Chloride: 105 mmol/L (ref 98–111)
Creatinine: 0.81 mg/dL (ref 0.44–1.00)
GFR, Estimated: >60 mL/min (ref >60)
Glucose, Bld: 132 mg/dL — ABNORMAL HIGH (ref 70–99)
Potassium: 3.6 mmol/L (ref 3.5–5.1)
Sodium: 141 mmol/L (ref 135–145)
Total Bilirubin: 0.3 mg/dL (ref <1.2)
Total Protein: 7.1 g/dL (ref 6.5–8.1)

## 2023-03-19 LAB — VITAMIN B12: Vitamin B-12: 369 pg/mL (ref 180–914)

## 2023-03-19 LAB — LACTATE DEHYDROGENASE: LDH: 197 U/L — ABNORMAL HIGH (ref 98–192)

## 2023-03-19 NOTE — Progress Notes (Signed)
Referral MD  Reason for Referral: Normochromic normocytic anemia  Chief Complaint  Patient presents with   New Patient (Initial Visit)    "My iron is low."  : I just do not feel well.  HPI: Caitlin White is a very charming 57 year old African-American female.  She is from [place].  She has been down in [place] for about 2 years or so.  She has family down here.  We actually see 2 of her daughters.  She actually had been seen at the [facility] back in 2022.  At that time, she had been seen because of some sclerotic bone lesions which are in her family history.  We see one of her daughters for the same thing and have never found any malignancy despite multiple biopsies.  She has had a PET scan.  She had a PET scan done back on 04/17/2021.  There is no increased activity.  However, the radiologist said that they cannot discount the possibility of metastatic disease.  She had a bone biopsy done on 05/23/2021.  The pathology report did not show any malignancy.  There is GIST was some sclerotic disease.  She was evaluated for possible the myeloma.  She had an SPEP done on 04/26/2021.  This was unremarkable.  Of note, she did have a gastric sleeve done back in 2013.  She does not smoke.  She has very rare alcohol use.  She has not had any problems with COVID.  There is no issues with nausea or vomiting.  She does have some chronic bowel issues.  She said her last colonoscopy was before she came down to [place]..    Her last mammogram was done back in June and everything looked fine.  She does have some neuropathy.  She is disabled because of her back.  She has had back surgery.    She is not a vegetarian.  Currently, I would have to say that her performance status is probably ECOG 1.   Past Medical History:  Diagnosis Date   Arthritis    Chronic pain    Hypertension    Interstitial lung disease (HCC)    Pneumonia    Sciatica   :   Past Surgical  History:  Procedure Laterality Date   ABDOMINAL HYSTERECTOMY     BACK SURGERY    :   Current Outpatient Medications:    acetaminophen (TYLENOL) 500 MG tablet, Take 500 mg by mouth every 6 (six) hours as needed., Disp: , Rfl:    clobetasol ointment (TEMOVATE) 0.05 %, Apply 1 Application topically daily., Disp: , Rfl:    ibuprofen (ADVIL) 600 MG tablet, Take 600 mg by mouth every 6 (six) hours as needed., Disp: , Rfl:    levothyroxine (SYNTHROID) 200 MCG tablet, Take 200 mcg by mouth daily., Disp: , Rfl:    losartan-hydrochlorothiazide (HYZAAR) 100-12.5 MG tablet, Take 1 tablet by mouth daily., Disp: , Rfl:    Multiple Vitamins-Minerals (CENTRUM ADULT PO), daily., Disp: , Rfl:    omeprazole (PRILOSEC) 20 MG capsule, Take 20 mg by mouth daily., Disp: , Rfl:    potassium chloride (KLOR-CON M) 10 MEQ tablet, TAKE 1 TABLET BY MOUTH EVERY DAY, Disp: 90 tablet, Rfl: 3   UNABLE TO FIND, Med Name: Iron 65mg  - takes 3 tabs every 2-3 days., Disp: , Rfl:    VITAMIN D PO, Take by mouth., Disp: , Rfl:    Vitamin D, Ergocalciferol, (DRISDOL) 1.25 MG (50000 UNIT) CAPS capsule, Take  50,000 Units by mouth once a week., Disp: , Rfl:    tiZANidine (ZANAFLEX) 4 MG tablet, Take 1 tablet (4 mg total) by mouth every 8 (eight) hours as needed for muscle spasms. (Patient not taking: Reported on 03/19/2023), Disp: 10 tablet, Rfl: 0:  :  No Known Allergies:  History reviewed. No pertinent family history.:   Social History   Socioeconomic History   Marital status: Married    Spouse name: Not on file   Number of children: Not on file   Years of education: Not on file   Highest education level: Not on file  Occupational History   Not on file  Tobacco Use   Smoking status: Never    Passive exposure: Never   Smokeless tobacco: Never  Vaping Use   Vaping status: Never Used  Substance and Sexual Activity   Alcohol use: Never   Drug use: Never   Sexual activity: Yes    Birth control/protection: Surgical   Other Topics Concern   Not on file  Social History Narrative   Not on file   Social Determinants of Health   Financial Resource Strain: Low Risk  (11/20/2022)   Overall Financial Resource Strain (CARDIA)    Difficulty of Paying Living Expenses: Not hard at all  Food Insecurity: No Food Insecurity (11/20/2022)   Hunger Vital Sign    Worried About Running Out of Food in the Last Year: Never true    Ran Out of Food in the Last Year: Never true  Transportation Needs: No Transportation Needs (11/20/2022)   PRAPARE - Administrator, Civil Service (Medical): No    Lack of Transportation (Non-Medical): No  Physical Activity: Inactive (11/20/2022)   Exercise Vital Sign    Days of Exercise per Week: 0 days    Minutes of Exercise per Session: 0 min  Stress: No Stress Concern Present (11/20/2022)   [employer] of Occupational Health - Occupational Stress Questionnaire    Feeling of Stress : Not at all  Social Connections: Moderately Isolated (11/20/2022)   Social Connection and Isolation Panel [NHANES]    Frequency of Communication with Friends and Family: More than three times a week    Frequency of Social Gatherings with Friends and Family: Once a week    Attends Religious Services: Never    Database administrator or Organizations: No    Attends Banker Meetings: Never    Marital Status: Married  Catering manager Violence: Not At Risk (11/20/2022)   Humiliation, Afraid, Rape, and Kick questionnaire    Fear of Current or Ex-Partner: No    Emotionally Abused: No    Physically Abused: No    Sexually Abused: No  :  Review of Systems  Constitutional:  Positive for malaise/fatigue.  HENT: Negative.    Eyes: Negative.   Respiratory: Negative.    Cardiovascular: Negative.   Gastrointestinal:  Positive for abdominal pain.  Genitourinary: Negative.   Musculoskeletal:  Positive for back pain.  Skin: Negative.   Neurological: Negative.   Endo/Heme/Allergies:  Negative.   Psychiatric/Behavioral: Negative.       Exam: Vital signs are temperature of 98.8.  Pulse 79.  Blood pressure 135/72.  Weight is 259 pounds.  @IPVITALS @ Physical Exam Vitals reviewed.  HENT:     Head: Normocephalic and atraumatic.  Eyes:     Pupils: Pupils are equal, round, and reactive to light.  Cardiovascular:     Rate and Rhythm: Normal rate and regular rhythm.  Heart sounds: Normal heart sounds.  Pulmonary:     Effort: Pulmonary effort is normal.     Breath sounds: Normal breath sounds.  Abdominal:     General: Bowel sounds are normal.     Palpations: Abdomen is soft.  Musculoskeletal:        General: No tenderness or deformity. Normal range of motion.     Cervical back: Normal range of motion.  Lymphadenopathy:     Cervical: No cervical adenopathy.  Skin:    General: Skin is warm and dry.     Findings: No erythema or rash.  Neurological:     Mental Status: She is alert and oriented to person, place, and time.  Psychiatric:        Behavior: Behavior normal.        Thought Content: Thought content normal.        Judgment: Judgment normal.      Recent Labs    03/19/23 1346  WBC 5.7  HGB 11.8*  HCT 34.5*  PLT 267    Recent Labs    03/19/23 1346  NA 141  K 3.6  CL 105  CO2 29  GLUCOSE 132*  BUN 16  CREATININE 0.81  CALCIUM 9.1    Blood smear review: Normochromic and normocytic population of red blood cells.  There are no teardrop cells.  I see no nucleated red blood cells.  There are no schistocytes or spherocytes.  She has no rouleaux formation.  There is no target cells.  White blood cells appear normal in morphology and maturation.  There is no immature myeloid or lymphoid forms.  There is no hypersegmented polys.  Platelets are adequate in number and size.  Pathology: None    Assessment and Plan: a [name] is a very nice 57 year old Afro-American female.  She has mild anemia.  Again, it is hard to say what is going on.  She  had did have a gastric sleeve.  This concerning cause some malabsorption issues.  At this point time, we will have to see what her iron levels look like.  Hopefully, we can send off a B12 level.  I just cannot imagine that she has any significant hematologic malignancy.  I do not see that she needs to have a bone marrow biopsy done.  I just do not think that this would really provide any additional information right now..  I do not see that we have to do any scans on her.  She had a CT angiogram of her chest just 2 months ago.  Once we get the results back from our labs, then we will see about having to get her back and seeing how we can help her.  If her iron levels are low, we will give her some IV iron.

## 2023-03-20 ENCOUNTER — Encounter: Payer: Self-pay | Admitting: Family

## 2023-03-20 ENCOUNTER — Ambulatory Visit (INDEPENDENT_AMBULATORY_CARE_PROVIDER_SITE_OTHER): Payer: Medicare HMO | Admitting: Family

## 2023-03-20 VITALS — BP 118/82 | HR 70 | Resp 18 | Ht 73.0 in | Wt 256.8 lb

## 2023-03-20 DIAGNOSIS — R7303 Prediabetes: Secondary | ICD-10-CM | POA: Diagnosis not present

## 2023-03-20 DIAGNOSIS — M79672 Pain in left foot: Secondary | ICD-10-CM

## 2023-03-20 DIAGNOSIS — Z23 Encounter for immunization: Secondary | ICD-10-CM | POA: Diagnosis not present

## 2023-03-20 DIAGNOSIS — M79671 Pain in right foot: Secondary | ICD-10-CM | POA: Diagnosis not present

## 2023-03-20 DIAGNOSIS — R011 Cardiac murmur, unspecified: Secondary | ICD-10-CM

## 2023-03-20 LAB — KAPPA/LAMBDA LIGHT CHAINS
Kappa free light chain: 31.1 mg/L — ABNORMAL HIGH (ref 3.3–19.4)
Kappa, lambda light chain ratio: 1.65 (ref 0.26–1.65)
Lambda free light chains: 18.8 mg/L (ref 5.7–26.3)

## 2023-03-20 LAB — HEMOGLOBIN A1C: Hgb A1c MFr Bld: 6 % (ref 4.6–6.5)

## 2023-03-20 NOTE — Progress Notes (Signed)
Caitlin White is a 57 y.o. female with the following history as recorded in EpicCare:  Patient Active Problem List   Diagnosis Date Noted   Poor sleep hygiene 06/30/2022   Snoring 06/30/2022   Poor compliance with CPAP treatment 06/30/2022   Insomnia secondary to chronic pain 02/05/2022   Excessive daytime sleepiness 02/05/2022   History of restless legs syndrome 02/05/2022   Failed back surgical syndrome 02/05/2022   Status post complete thyroidectomy 02/05/2022   Bone lesion 04/30/2021   H/O bilateral breast reduction surgery 12/26/2015   H/O abdominal supracervical subtotal hysterectomy 04/17/2014    Current Outpatient Medications  Medication Sig Dispense Refill   acetaminophen (TYLENOL) 500 MG tablet Take 500 mg by mouth every 6 (six) hours as needed.     clobetasol ointment (TEMOVATE) 0.05 % Apply 1 Application topically daily.     ibuprofen (ADVIL) 600 MG tablet Take 600 mg by mouth every 6 (six) hours as needed.     levothyroxine (SYNTHROID) 200 MCG tablet Take 200 mcg by mouth daily.     losartan-hydrochlorothiazide (HYZAAR) 100-12.5 MG tablet Take 1 tablet by mouth daily.     Multiple Vitamins-Minerals (CENTRUM ADULT PO) daily.     omeprazole (PRILOSEC) 20 MG capsule Take 20 mg by mouth daily.     potassium chloride (KLOR-CON M) 10 MEQ tablet TAKE 1 TABLET BY MOUTH EVERY DAY 90 tablet 3   UNABLE TO FIND Med Name: Iron 65mg  - takes 3 tabs every 2-3 days.     VITAMIN D PO Take by mouth.     Vitamin D, Ergocalciferol, (DRISDOL) 1.25 MG (50000 UNIT) CAPS capsule Take 50,000 Units by mouth once a week.     tiZANidine (ZANAFLEX) 4 MG tablet Take 1 tablet (4 mg total) by mouth every 8 (eight) hours as needed for muscle spasms. (Patient not taking: Reported on 03/20/2023) 10 tablet 0   No current facility-administered medications for this visit.    Allergies: Patient has no known allergies.  Past Medical History:  Diagnosis Date   Arthritis    Chronic pain    Hypertension     Interstitial lung disease (HCC)    Pneumonia    Sciatica     Past Surgical History:  Procedure Laterality Date   ABDOMINAL HYSTERECTOMY     BACK SURGERY      No family history on file.  Social History   Tobacco Use   Smoking status: Never    Passive exposure: Never   Smokeless tobacco: Never  Substance Use Topics   Alcohol use: Never    Subjective:   Asking for multiple referrals- trying to make sure healthcare needs are up to date; she had called yesterday with concerns about her health due to her daughter's diagnosis of sarcoidosis/ patient is already scheduled to follow up with pulmonology next week;   Would like to get flu and pneumonia shot today;   Objective:  Vitals:   03/20/23 0816  BP: 118/82  Pulse: 70  Resp: 18  SpO2: 97%  Weight: 256 lb 12.8 oz (116.5 kg)  Height: 6\' 1"  (1.854 m)    General: Well developed, well nourished, in no acute distress  Skin : Warm and dry.  Head: Normocephalic and atraumatic  Lungs: Respirations unlabored; clear to auscultation bilaterally without wheeze, rales, rhonchi  CVS exam: normal rate and regular rhythm.  Neurologic: Alert and oriented; speech intact; face symmetrical; moves all extremities well; CNII-XII intact without focal deficit   Assessment:  1. Pain of both  heels   2. Heart murmur   3. Pre-diabetes   4. Need for influenza vaccination   5. Need for pneumococcal 20-valent conjugate vaccination     Plan:  Referral to podiatry; Referral to cardiology per patient request;  Reviewed with patient that she saw GYN and had pap smear earlier this year; basic labs were updated here in May 2024; she has previous referrals to GI and neurology that she has not responded to- she is given that information and understands she needs to call to schedule her follow up;   Time spent 30 minutes- reviewing notes and discussing treatment plans No follow-ups on file.  Orders Placed This Encounter  Procedures   Flu vaccine  trivalent PF, 6mos and older(Flulaval,Afluria,Fluarix,Fluzone)   Pneumococcal conjugate vaccine 20-valent (Prevnar 20)   Hemoglobin A1c   Ambulatory referral to Podiatry    Referral Priority:   Routine    Referral Type:   Consultation    Referral Reason:   Specialty Services Required    Requested Specialty:   Podiatry    Number of Visits Requested:   1   Ambulatory referral to Cardiology    Referral Priority:   Routine    Referral Type:   Consultation    Referral Reason:   Specialty Services Required    Number of Visits Requested:   1    Requested Prescriptions    No prescriptions requested or ordered in this encounter

## 2023-03-20 NOTE — Patient Instructions (Addendum)
Your referral to GI was done previously. You need to call them to follow up. This is a copy of the message they sent to you 2 weeks ago.  Good morning Caitlin White,   Our office received a referral from your primary care provider to schedule an appointment. If you will give our office a call at your convenience to discuss scheduling at  [phone] option 1   Thank you, [facility]   [facility] Neurology - [facility] formerly known as [facility], clinical Neurology at [facility] [address]  [address]  [place], [place] [address]  [phone]

## 2023-03-21 LAB — IGG, IGA, IGM
IgA: 455 mg/dL — ABNORMAL HIGH (ref 87–352)
IgG (Immunoglobin G), Serum: 1240 mg/dL (ref 586–1602)
IgM (Immunoglobulin M), Srm: 57 mg/dL (ref 26–217)

## 2023-03-21 LAB — ERYTHROPOIETIN: Erythropoietin: 10.4 m[IU]/mL (ref 2.6–18.5)

## 2023-03-23 LAB — HGB FRACTIONATION CASCADE
Hgb A2: 2.4 % (ref 1.8–3.2)
Hgb A: 97.6 % (ref 96.4–98.8)
Hgb F: 0 % (ref 0.0–2.0)
Hgb S: 0 %

## 2023-03-25 ENCOUNTER — Ambulatory Visit (INDEPENDENT_AMBULATORY_CARE_PROVIDER_SITE_OTHER): Payer: Medicare HMO | Admitting: Pulmonary Disease

## 2023-03-25 ENCOUNTER — Encounter: Payer: Self-pay | Admitting: Pulmonary Disease

## 2023-03-25 VITALS — BP 132/80 | HR 70 | Ht 73.0 in | Wt 262.0 lb

## 2023-03-25 DIAGNOSIS — J849 Interstitial pulmonary disease, unspecified: Secondary | ICD-10-CM | POA: Diagnosis not present

## 2023-03-25 DIAGNOSIS — G4733 Obstructive sleep apnea (adult) (pediatric): Secondary | ICD-10-CM

## 2023-03-25 NOTE — Patient Instructions (Signed)
VISIT SUMMARY:  You came in today for a follow-up visit after being diagnosed with a lung infection. Previously, you had chest pain and discomfort, and tests showed mild pneumonia. You were treated with antibiotics and are feeling better now. We also discussed your sleep apnea and the challenges you face in using your CPAP machine regularly.  YOUR PLAN:  -PNEUMONIA: Pneumonia is an infection that inflames the air sacs in one or both lungs. You were treated with antibiotics and are currently not experiencing any respiratory symptoms. We will order a follow-up CT scan in the next few weeks to ensure the infection has fully resolved.  -SLEEP APNEA: Sleep apnea is a condition where breathing repeatedly stops and starts during sleep. You mentioned difficulties in using your CPAP machine regularly. It is important to resume using your CPAP machine to manage your sleep apnea effectively.  INSTRUCTIONS:  Please follow up in 6 months. We will communicate the results of your CT scan in 2-3 weeks due to a radiology backlog.

## 2023-03-25 NOTE — Progress Notes (Signed)
Caitlin White    [id number]    April 04, 1966  Primary Care Physician:Murray, Allyne Gee, FNP  Referring Physician: Olive Bass, FNP [address] [place],  [place] [address]  Chief complaint: Follow-up for interstitial lung disease  HPI: 57 y.o. with history of unspecified interstitial lung disease, sleep apnea She has moved here from [place] and is here to establish care Previously followed by Dr. Lamar Laundry in [place], [place]  As per the patient she had severe bilateral pneumonia in 2013 requiring hospitalizations and fluid drainage.  She developed interstitial lung disease after that but does not recall the specific details.  She has been followed by pulmonary since then for monitoring and is not on any specific treatment History also notable for sleep apnea and has been on CPAP for the past 3 years  At present denies any dyspnea, fevers, chills.  Received clinic notes from patient's pulmonologist Dr. Treasa School dated 07/18/2020.  Review of anxiety Patient has history of interstitial lung disease, nonspecific ILD.   Open lung biopsy right lower lobe on 06/10/2012, she completed a long course of steroids with complete resolution of infiltrates  Core IR biopsy of right middle lobe nodule on 08/17/2015-patchy chronic interstitial inflammation and alveolar histiocytes, negative for granulomata, vasculitis or neoplasm.  She completed 2 months of prednisone from April to May 2017  She follows Dr. Dorene Ar for evaluation of numeric hyperintense of lesions affecting vertebral bodies which is stable for 4 years.  History of total thyroidectomy for thyroid nodules in November 2020  Has history of OSA on CPAP  --------------------------------------------  Pets: No pets Occupation: Disabled since 2021.  Used to work as a Research scientist (medical) Exposures: No mold, hot tub, Jacuzzi.  No feather pillows or comforters Smoking history: Never  smoker Travel history: Originally from [place].  Moved to [place] in 2022 Relevant family history: No significant family history of lung disease  Interim history: Discussed the use of AI scribe software for clinical note transcription with the patient, who gave verbal consent to proceed.  The patient, with a history of sleep apnea, presents for follow-up after a recent diagnosis of a lung infection. She initially presented to the ED in May with chest pain, right-sided discomfort, and difficulty getting out of the car due to pain under her arm. At that time, an x-ray showed clear lungs and an EKG was normal. However, in September, she saw her primary care physician with similar complaints, and a D-dimer was obtained due to concern for a pulmonary embolism. A subsequent CT angiogram was negative for clots but showed signs of a lower lobe pneumonia. She was treated with antibiotics and reports feeling better overall. All ED and clinic notes, imaging reviewed in detail.  She also reports being "lazy" with her CPAP machine for sleep apnea, citing the demands of caring for grandchildren and the effort of cleaning the machine as barriers to regular use.   ----------------------------------- Outpatient Encounter Medications as of 03/25/2023  Medication Sig   acetaminophen (TYLENOL) 500 MG tablet Take 500 mg by mouth every 6 (six) hours as needed.   clobetasol ointment (TEMOVATE) 0.05 % Apply 1 Application topically daily.   levothyroxine (SYNTHROID) 200 MCG tablet Take 200 mcg by mouth daily.   losartan-hydrochlorothiazide (HYZAAR) 100-12.5 MG tablet Take 1 tablet by mouth daily.   Multiple Vitamins-Minerals (CENTRUM ADULT PO) daily.   omeprazole (PRILOSEC) 20 MG capsule Take 20  mg by mouth daily.   potassium chloride (KLOR-CON M) 10 MEQ tablet TAKE 1 TABLET BY MOUTH EVERY DAY   tiZANidine (ZANAFLEX) 4 MG tablet Take 1 tablet (4 mg total) by mouth every 8 (eight) hours as needed for muscle  spasms. (Patient not taking: Reported on 03/20/2023)   UNABLE TO FIND Med Name: Iron 65mg  - takes 3 tabs every 2-3 days.   VITAMIN D PO Take by mouth.   Vitamin D, Ergocalciferol, (DRISDOL) 1.25 MG (50000 UNIT) CAPS capsule Take 50,000 Units by mouth once a week.   [DISCONTINUED] ibuprofen (ADVIL) 600 MG tablet Take 600 mg by mouth every 6 (six) hours as needed. (Patient not taking: Reported on 03/25/2023)   No facility-administered encounter medications on file as of 03/25/2023.    Physical Exam: Blood pressure 132/80, pulse 70, height 6\' 1"  (1.854 m), weight 262 lb (118.8 kg), SpO2 100%. Gen:      No acute distress HEENT:  EOMI, sclera anicteric Neck:     No masses; no thyromegaly Lungs:    Clear to auscultation bilaterally; normal respiratory effort CV:         Regular rate and rhythm; no murmurs Abd:      + bowel sounds; soft, non-tender; no palpable masses, no distension Ext:    No edema; adequate peripheral perfusion Skin:      Warm and dry; no rash Neuro: alert and oriented x 3 Psych: normal mood and affect   Data Reviewed: Imaging: CT abdomen pelvis 02/24/2021-visualized lung bases are clear.  Multiple pulmonary nodules measuring up to 3 mm.    CT high-resolution 04/03/2021-no interstitial lung disease, scattered sclerotic osseous lesions, 5 mm lung nodule  PET scan 04/17/2021-multifocal areas of bony sclerosis without metabolic activity  Chest x-ray 01/23/2023-no active cardiopulmonary disease  CT angiogram 01/26/2023-no PE, mild groundglass opacities in the lower lobes. I have reviewed the images personally.  PFTs: 07/04/2021 FVC 3.22 [84%], FEV1 2.95 [9 6%], F/F 92, TLC 5.40 [85%], DLCO 14.81 [54%] Isolated diffusion defect  Labs:    Assessment:  Pneumonia Recent history of pneumonia treated with antibiotics. No current respiratory symptoms. CT angiogram in September showed mild pneumonia/bronchitis. -Order follow-up CT in the next few weeks to ensure  resolution.  Evaluation of interstitial lung disease She has unspecified interstitial lung disease after hospitalization for pneumonia in 2013 Which was treated with a long course of steroids.  Previous imaging did not show any interstitial lung disease.  Review on follow-up CT  PFTs do show an isolated diffusion defect.  Echocardiogram ordered but not completed yet.  Since he is asymptomatic we will monitor.  Sclerotic bone lesions. This has been a longstanding issue per records from [place].  Work-up here did not show any malignancy She has been evaluated by oncology for sclerotic lesions of the bone with PET scan and bone biopsy which did not reveal any malignancy.  Sleep Apnea Non-compliance with CPAP therapy due to personal reasons. -Encouraged to resume CPAP therapy.  Follow-up in 6 months. CT results will be communicated in 2-3 weeks due to radiology backlog.     Plan/Recommendations: CT follow-up CPAP compliance  Chilton Greathouse MD [facility] 03/25/2023, 8:58 AM  CC: Olive Bass,*

## 2023-04-07 ENCOUNTER — Other Ambulatory Visit: Payer: Self-pay

## 2023-04-07 DIAGNOSIS — J189 Pneumonia, unspecified organism: Secondary | ICD-10-CM | POA: Insufficient documentation

## 2023-04-07 DIAGNOSIS — J849 Interstitial pulmonary disease, unspecified: Secondary | ICD-10-CM | POA: Insufficient documentation

## 2023-04-07 DIAGNOSIS — E890 Postprocedural hypothyroidism: Secondary | ICD-10-CM

## 2023-04-07 DIAGNOSIS — M1990 Unspecified osteoarthritis, unspecified site: Secondary | ICD-10-CM | POA: Insufficient documentation

## 2023-04-07 DIAGNOSIS — Z9989 Dependence on other enabling machines and devices: Secondary | ICD-10-CM

## 2023-04-07 DIAGNOSIS — I10 Essential (primary) hypertension: Secondary | ICD-10-CM | POA: Insufficient documentation

## 2023-04-07 DIAGNOSIS — M5430 Sciatica, unspecified side: Secondary | ICD-10-CM | POA: Insufficient documentation

## 2023-04-07 DIAGNOSIS — M19079 Primary osteoarthritis, unspecified ankle and foot: Secondary | ICD-10-CM | POA: Insufficient documentation

## 2023-04-07 DIAGNOSIS — E079 Disorder of thyroid, unspecified: Secondary | ICD-10-CM

## 2023-04-07 DIAGNOSIS — K219 Gastro-esophageal reflux disease without esophagitis: Secondary | ICD-10-CM | POA: Insufficient documentation

## 2023-04-07 DIAGNOSIS — D509 Iron deficiency anemia, unspecified: Secondary | ICD-10-CM

## 2023-04-07 DIAGNOSIS — G8929 Other chronic pain: Secondary | ICD-10-CM | POA: Insufficient documentation

## 2023-04-07 DIAGNOSIS — G4733 Obstructive sleep apnea (adult) (pediatric): Secondary | ICD-10-CM

## 2023-04-07 DIAGNOSIS — Z6836 Body mass index (BMI) 36.0-36.9, adult: Secondary | ICD-10-CM | POA: Insufficient documentation

## 2023-04-07 DIAGNOSIS — E039 Hypothyroidism, unspecified: Secondary | ICD-10-CM | POA: Insufficient documentation

## 2023-04-07 DIAGNOSIS — G2581 Restless legs syndrome: Secondary | ICD-10-CM | POA: Insufficient documentation

## 2023-04-07 HISTORY — DX: Obstructive sleep apnea (adult) (pediatric): G47.33

## 2023-04-07 HISTORY — DX: Primary osteoarthritis, unspecified ankle and foot: M19.079

## 2023-04-07 HISTORY — DX: Body mass index (BMI) 36.0-36.9, adult: Z68.36

## 2023-04-07 HISTORY — DX: Gastro-esophageal reflux disease without esophagitis: K21.9

## 2023-04-07 HISTORY — DX: Iron deficiency anemia, unspecified: D50.9

## 2023-04-07 HISTORY — DX: Disorder of thyroid, unspecified: E07.9

## 2023-04-07 HISTORY — DX: Dependence on other enabling machines and devices: Z99.89

## 2023-04-07 HISTORY — DX: Restless legs syndrome: G25.81

## 2023-04-07 HISTORY — DX: Postprocedural hypothyroidism: E89.0

## 2023-04-08 ENCOUNTER — Encounter: Payer: Self-pay | Admitting: Cardiology

## 2023-04-08 ENCOUNTER — Ambulatory Visit: Payer: Medicare HMO | Attending: Cardiology | Admitting: Cardiology

## 2023-04-08 ENCOUNTER — Ambulatory Visit
Admission: RE | Admit: 2023-04-08 | Discharge: 2023-04-08 | Disposition: A | Discharge status: Home / Self Care | Payer: Medicare HMO | Source: Ambulatory Visit | Attending: Pulmonary Disease | Admitting: Pulmonary Disease

## 2023-04-08 VITALS — BP 166/90 | HR 74 | Ht 73.0 in | Wt 258.1 lb

## 2023-04-08 DIAGNOSIS — J439 Emphysema, unspecified: Secondary | ICD-10-CM | POA: Diagnosis not present

## 2023-04-08 DIAGNOSIS — J849 Interstitial pulmonary disease, unspecified: Secondary | ICD-10-CM

## 2023-04-08 DIAGNOSIS — R011 Cardiac murmur, unspecified: Secondary | ICD-10-CM | POA: Diagnosis not present

## 2023-04-08 DIAGNOSIS — I10 Essential (primary) hypertension: Secondary | ICD-10-CM

## 2023-04-08 DIAGNOSIS — G4733 Obstructive sleep apnea (adult) (pediatric): Secondary | ICD-10-CM | POA: Diagnosis not present

## 2023-04-08 DIAGNOSIS — J984 Other disorders of lung: Secondary | ICD-10-CM | POA: Diagnosis not present

## 2023-04-08 NOTE — Patient Instructions (Signed)

## 2023-04-08 NOTE — Progress Notes (Signed)
Cardiology Office Note:    Date:  04/08/2023   ID:  Caitlin White, DOB [birth year], MRN [id number]  PCP:  Caitlin Bass, FNP  Cardiologist:  Garwin Brothers, MD   Referring MD: Caitlin White,*    ASSESSMENT:    1. Heart murmur   2. Primary hypertension   3. Obstructive sleep apnea   4. Interstitial lung disease (HCC)    PLAN:    In order of problems listed above:  Primary prevention stressed with the patient.  Importance of compliance with diet medication stressed and patient verbalized standing. Dyspnea on exertion: Stable in nature.  Patient has significant pulmonary pathology and most likely cause of her dyspnea.  It has been stable.  I reviewed CT scan report.  There is no evidence of any coronary calcification or such issues.  Medical management at this time.  She offers no complaints from a cardiac standpoint. Cardiac murmur: Echocardiogram will be done to assess murmur heard on auscultation. Essential hypertension: Blood pressure stable and diet was emphasized.  Lifestyle modification urged.  We checked her blood pressure and was 140/70.  This was at the end of the visit. Mixed dyslipidemia: Lipids were reviewed.  Diet was emphasized and she promises to do better. Obesity: Weight reduction stressed and she mentions to me that she has been lax with her diet and promises to do better. Patient will be seen in follow-up appointment in 12 months or earlier if the patient has any concerns.    Medication Adjustments/Labs and Tests Ordered: Current medicines are reviewed at length with the patient today.  Concerns regarding medicines are outlined above.  Orders Placed This Encounter  Procedures   EKG 12-Lead   No orders of the defined types were placed in this encounter.    History of Present Illness:    Caitlin White is a 57 y.o. female who is being seen today for the evaluation of dyspnea on exertion at the request of Caitlin White,*.  Patient  is a pleasant 57 year old female.  She has past medical history of pulmonary disease notably interstitial lung disease, obstructive sleep apnea.  She mentions to me that she is on disability because of orthopedic issues.  She denies any chest pain orthopnea or PND.  She wants to be established.  At the time of my evaluation, the patient is alert awake oriented and in no distress.  Past Medical History:  Diagnosis Date   Arthritis    Body mass index (BMI) 36.0-36.9, adult 04/07/2023   Bone lesion 04/30/2021   Chronic pain    Dependence on other enabling machines and devices 04/07/2023   Disorder of thyroid gland 04/07/2023   Excessive daytime sleepiness 02/05/2022   Failed back surgical syndrome 02/05/2022   Gastroesophageal reflux disease 04/07/2023   H/O abdominal supracervical subtotal hysterectomy 04/17/2014   H/O bilateral breast reduction surgery 12/26/2015   Formatting of this note might be different from the original. 2001     History of restless legs syndrome 02/05/2022   Hypertension    Insomnia secondary to chronic pain 02/05/2022   Interstitial lung disease (HCC)    Iron deficiency anemia 04/07/2023   Obstructive sleep apnea 04/07/2023   Pap smear abnormality of cervix with ASCUS favoring dysplasia 04/17/2014   Pneumonia    Poor compliance with CPAP treatment 06/30/2022   Poor sleep hygiene 06/30/2022   Post-surgical hypothyroidism 04/07/2023   Primary osteoarthritis, unspecified ankle and foot 04/07/2023   Restless legs syndrome 04/07/2023   Sciatica  Snoring 06/30/2022   Status post complete thyroidectomy 02/05/2022    Past Surgical History:  Procedure Laterality Date   ABDOMINAL HYSTERECTOMY     BACK SURGERY      Current Medications: Current Meds  Medication Sig   acetaminophen (TYLENOL) 500 MG tablet Take 500 mg by mouth every 6 (six) hours as needed for mild pain (pain score 1-3) or moderate pain (pain score 4-6).   clobetasol ointment (TEMOVATE) 0.05 %  Apply 1 Application topically daily.   levothyroxine (SYNTHROID) 200 MCG tablet Take 200 mcg by mouth daily.   losartan-hydrochlorothiazide (HYZAAR) 100-12.5 MG tablet Take 1 tablet by mouth daily.   Multiple Vitamins-Minerals (CENTRUM ADULT PO) Take 1 tablet by mouth daily.   omeprazole (PRILOSEC) 20 MG capsule Take 20 mg by mouth daily.   potassium chloride (KLOR-CON M) 10 MEQ tablet TAKE 1 TABLET BY MOUTH EVERY DAY   tiZANidine (ZANAFLEX) 4 MG tablet Take 1 tablet (4 mg total) by mouth every 8 (eight) hours as needed for muscle spasms.   VITAMIN D PO Take 1 tablet by mouth daily.   Vitamin D, Ergocalciferol, (DRISDOL) 1.25 MG (50000 UNIT) CAPS capsule Take 50,000 Units by mouth once a week.     Allergies:   Patient has no known allergies.   Social History   Socioeconomic History   Marital status: Married    Spouse name: Not on file   Number of children: Not on file   Years of education: Not on file   Highest education level: Not on file  Occupational History   Not on file  Tobacco Use   Smoking status: Never    Passive exposure: Never   Smokeless tobacco: Never  Vaping Use   Vaping status: Never Used  Substance and Sexual Activity   Alcohol use: Never   Drug use: Never   Sexual activity: Yes    Birth control/protection: Surgical  Other Topics Concern   Not on file  Social History Narrative   Not on file   Social Determinants of Health   Financial Resource Strain: Low Risk  (11/20/2022)   Overall Financial Resource Strain (CARDIA)    Difficulty of Paying Living Expenses: Not hard at all  Food Insecurity: No Food Insecurity (11/20/2022)   Hunger Vital Sign    Worried About Running Out of Food in the Last Year: Never true    Ran Out of Food in the Last Year: Never true  Transportation Needs: No Transportation Needs (11/20/2022)   PRAPARE - Administrator, Civil Service (Medical): No    Lack of Transportation (Non-Medical): No  Physical Activity: Inactive  (11/20/2022)   Exercise Vital Sign    Days of Exercise per Week: 0 days    Minutes of Exercise per Session: 0 min  Stress: No Stress Concern Present (11/20/2022)   [employer] of Occupational Health - Occupational Stress Questionnaire    Feeling of Stress : Not at all  Social Connections: Moderately Isolated (11/20/2022)   Social Connection and Isolation Panel [NHANES]    Frequency of Communication with Friends and Family: More than three times a week    Frequency of Social Gatherings with Friends and Family: Once a week    Attends Religious Services: Never    Database administrator or Organizations: No    Attends Banker Meetings: Never    Marital Status: Married     Family History: The patient's family history is not on file.  ROS:   Please  see the history of present illness.    All other systems reviewed and are negative.  EKGs/Labs/Other Studies Reviewed:    The following studies were reviewed today:  EKG Interpretation Date/Time:  Wednesday April 08 2023 11:23:13 EST Ventricular Rate:  74 PR Interval:  172 QRS Duration:  70 QT Interval:  394 QTC Calculation: 437 R Axis:   11  Text Interpretation: Normal sinus rhythm Normal ECG When compared with ECG of 26-Sep-2022 09:15, PREVIOUS ECG IS PRESENT Confirmed by Belva Crome [phone]) on 04/08/2023 11:33:23 AM     Recent Labs: 01/23/2023: TSH 2.04 03/19/2023: ALT 14; BUN 16; Creatinine 0.81; Hemoglobin 11.8; Platelet Count 267; Potassium 3.6; Sodium 141  Recent Lipid Panel    Component Value Date/Time   CHOL 206 (H) 10/07/2022 1133   TRIG 162.0 (H) 10/07/2022 1133   HDL 54.10 10/07/2022 1133   CHOLHDL 4 10/07/2022 1133   VLDL 32.4 10/07/2022 1133   LDLCALC 119 (H) 10/07/2022 1133    Physical Exam:    VS:  BP (!) 166/90   Pulse 74   Ht 6\' 1"  (1.854 m)   Wt 258 lb 1.3 oz (117.1 kg)   SpO2 98%   BMI 34.05 kg/m     Wt Readings from Last 3 Encounters:  04/08/23 258 lb 1.3 oz (117.1 kg)   03/25/23 262 lb (118.8 kg)  03/20/23 256 lb 12.8 oz (116.5 kg)     GEN: Patient is in no acute distress HEENT: Normal NECK: No JVD; No carotid bruits LYMPHATICS: No lymphadenopathy CARDIAC: S1 S2 regular, 2/6 systolic murmur at the apex. RESPIRATORY:  Clear to auscultation without rales, wheezing or rhonchi  ABDOMEN: Soft, non-tender, non-distended MUSCULOSKELETAL:  No edema; No deformity  SKIN: Warm and dry NEUROLOGIC:  Alert and oriented x 3 PSYCHIATRIC:  Normal affect    Signed, Garwin Brothers, MD  04/08/2023 1:37 PM    [facility]

## 2023-04-08 NOTE — Addendum Note (Signed)
Addended by: Eleonore Chiquito on: 04/08/2023 01:53 PM   Modules accepted: Orders

## 2023-04-13 ENCOUNTER — Ambulatory Visit (INDEPENDENT_AMBULATORY_CARE_PROVIDER_SITE_OTHER): Payer: Medicare HMO | Admitting: Podiatry

## 2023-04-13 ENCOUNTER — Ambulatory Visit (INDEPENDENT_AMBULATORY_CARE_PROVIDER_SITE_OTHER): Payer: Medicare HMO

## 2023-04-13 ENCOUNTER — Encounter: Payer: Self-pay | Admitting: Podiatry

## 2023-04-13 DIAGNOSIS — M62461 Contracture of muscle, right lower leg: Secondary | ICD-10-CM | POA: Diagnosis not present

## 2023-04-13 DIAGNOSIS — M7662 Achilles tendinitis, left leg: Secondary | ICD-10-CM | POA: Diagnosis not present

## 2023-04-13 DIAGNOSIS — M7661 Achilles tendinitis, right leg: Secondary | ICD-10-CM | POA: Diagnosis not present

## 2023-04-13 DIAGNOSIS — M62462 Contracture of muscle, left lower leg: Secondary | ICD-10-CM

## 2023-04-13 DIAGNOSIS — M722 Plantar fascial fibromatosis: Secondary | ICD-10-CM

## 2023-04-13 MED ORDER — METHYLPREDNISOLONE 4 MG PO TBPK: ORAL_TABLET | ORAL | 0 refills | Status: DC

## 2023-04-13 NOTE — Progress Notes (Signed)
  Subjective:  Patient ID: Caitlin White, female    DOB: 07-25-1965,  MRN: [id number]  Chief Complaint  Patient presents with   Foot Pain    "The back of my heels hurt."    57 y.o. female presents with the above complaint. History confirmed with patient.  She returns for follow-up.  She does not recall being here previously but must have gotten better, she does not remember she went to physical therapy or not both feet are bothering her now but the right is worse.  Objective:  Physical Exam: warm, good capillary refill, no trophic changes or ulcerative lesions, normal DP and PT pulses, and normal sensory exam.  Bilateral she has gastrocnemius equinus and palpable spurring on the posterior heel, more tender on the right Achilles insertion   Radiographs: Multiple views x-ray of the bilateral foot: no fracture, dislocation, swelling or degenerative changes noted and posterior calcaneal spur, right side is unchanged since last time Assessment:   1. Achilles tendonitis, bilateral   2. Gastrocnemius equinus of left lower extremity   3. Gastrocnemius equinus of right lower extremity      Plan:  Patient was evaluated and treated and all questions answered.  Today we again discussed the etiology and treatment options for Achilles tendinitis including stretching, formal physical therapy with an eccentric exercises therapy plan, supportive shoegears such as a running shoe or sneaker, heel lifts, topical and oral medications.  We also discussed that I do not routinely perform injections in this area because of the risk of an increased damage or rupture of the tendon.  We also discussed the role of surgical treatment of this for patients who do not improve after exhausting non-surgical treatment options.  -XR reviewed with patient -Educated on stretching and icing of the affected limb. -Referral placed to physical therapy at [facility]. -Rx for Medrol 6-day taper. Advised on risks, benefits,  and alternatives of the medication.  She cannot tolerate NSAIDs due to previous gastric sleeve -Now that she is dealing with bilateral tendinitis I do not think that CAM boot immobilization is a viable option as this might exacerbate the contralateral limb.  I recommended treating with heel lifts and felt heel lifts were dispensed today.  Return in about 2 months (around 06/14/2023).

## 2023-04-13 NOTE — Patient Instructions (Signed)

## 2023-04-14 ENCOUNTER — Other Ambulatory Visit: Payer: Self-pay

## 2023-04-14 ENCOUNTER — Telehealth: Payer: Self-pay

## 2023-04-14 DIAGNOSIS — M722 Plantar fascial fibromatosis: Secondary | ICD-10-CM

## 2023-04-14 DIAGNOSIS — M7661 Achilles tendinitis, right leg: Secondary | ICD-10-CM

## 2023-04-14 DIAGNOSIS — M62462 Contracture of muscle, left lower leg: Secondary | ICD-10-CM

## 2023-04-14 DIAGNOSIS — M62461 Contracture of muscle, right lower leg: Secondary | ICD-10-CM

## 2023-04-14 NOTE — Telephone Encounter (Signed)
Referral, demographics and office note faxed to [facility] Phone [phone] [phone] Confirmation received

## 2023-04-14 NOTE — Telephone Encounter (Signed)
-----   Message from Edwin Cap sent at 04/13/2023 12:50 PM EST ----- Can you send this PT referral to [facility]?  Thank you!

## 2023-04-27 ENCOUNTER — Telehealth: Payer: Self-pay | Admitting: Family

## 2023-04-27 ENCOUNTER — Telehealth: Payer: Self-pay | Admitting: Pulmonary Disease

## 2023-04-27 DIAGNOSIS — J849 Interstitial pulmonary disease, unspecified: Secondary | ICD-10-CM

## 2023-04-27 NOTE — Telephone Encounter (Signed)
Pt called to discuss lab results with a nurse. Please call and advise.

## 2023-04-27 NOTE — Telephone Encounter (Signed)
Spoke with pt, pt states she would like me to go over her CXR results with her, informed pt PCP is not th provider who ordered the CXR. Advised pt it was her pulmonologist who scheduled the Xray and their office will go over the results with her. Pt expressed understanding and stated she will "give them a call"

## 2023-04-28 ENCOUNTER — Ambulatory Visit (HOSPITAL_BASED_OUTPATIENT_CLINIC_OR_DEPARTMENT_OTHER)
Admission: RE | Admit: 2023-04-28 | Discharge: 2023-04-28 | Disposition: A | Discharge status: Home / Self Care | Payer: Medicare HMO | Source: Ambulatory Visit | Attending: Cardiology | Admitting: Cardiology

## 2023-04-28 DIAGNOSIS — R011 Cardiac murmur, unspecified: Secondary | ICD-10-CM | POA: Insufficient documentation

## 2023-04-28 LAB — ECHOCARDIOGRAM COMPLETE
AR max vel: 2.04 cm2
AV Area VTI: 2 cm2
AV Area mean vel: 1.98 cm2
AV Mean grad: 7 mm[Hg]
AV Peak grad: 12.3 mm[Hg]
Ao pk vel: 1.75 m/s
Area-P 1/2: 3.68 cm2
Calc EF: 65.6 %
MV M vel: 2.38 m/s
MV Peak grad: 22.7 mm[Hg]
S' Lateral: 2.5 cm
Single Plane A2C EF: 60.1 %
Single Plane A4C EF: 67.8 %

## 2023-04-30 MED ORDER — PREDNISONE 10 MG PO TABS: 20.0000 mg | ORAL_TABLET | Freq: Every day | ORAL | 3 refills | Status: DC

## 2023-04-30 NOTE — Telephone Encounter (Signed)
Called discussed results with patient. CT scan findings are suggestive of organizing pneumonia.  She is already being treated with antibiotics but there is no change in her scan from September.  Suspicion for ongoing infection is low Will start on prednisone 20 mg a day Order for serologies for connective tissue disease has been placed.  Please call the patient and make a follow-up visit with me at next available.

## 2023-05-04 ENCOUNTER — Telehealth: Payer: Self-pay | Admitting: Pulmonary Disease

## 2023-05-04 NOTE — Addendum Note (Signed)
Addended byClyda Greener M on: 05/04/2023 08:58 AM   Modules accepted: Orders

## 2023-05-04 NOTE — Telephone Encounter (Signed)
Patient needs blood work to be sent to [facility].

## 2023-05-04 NOTE — Telephone Encounter (Signed)
Orders were updated today.

## 2023-05-04 NOTE — Addendum Note (Signed)
Addended by: Thelma Barge D on: 05/04/2023 08:27 AM   Modules accepted: Orders

## 2023-05-12 IMAGING — CT CT CHEST HIGH RESOLUTION
2 of 7 series · 14 of 36 positions shown, 17 images · non-contrast
Comparison: CT abdomen pelvis 02/24/2021.

CLINICAL DATA: Interstitial lung disease.

EXAM:
CT CHEST WITHOUT CONTRAST
TECHNIQUE: Multidetector CT imaging of the chest was performed following the
standard protocol without intravenous contrast. High resolution
imaging of the lungs, as well as inspiratory and expiratory imaging,
was performed.

[Series 6: high resolution · axial · 0.61mm/px · z∈[+756,+1010]mm · 11 of 307 slices shown, 14 images]
[im 26/307  mediastinal]
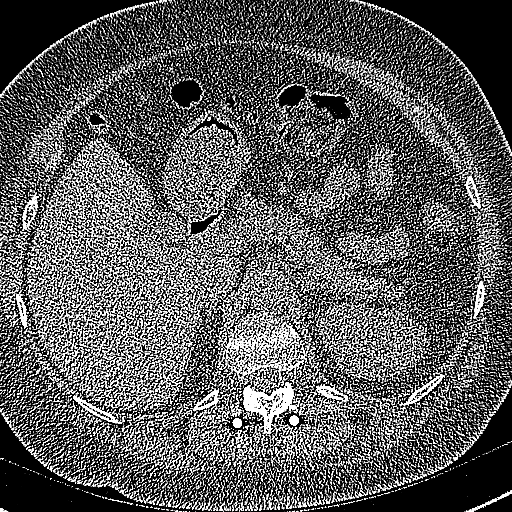
[im 26/307  lung]
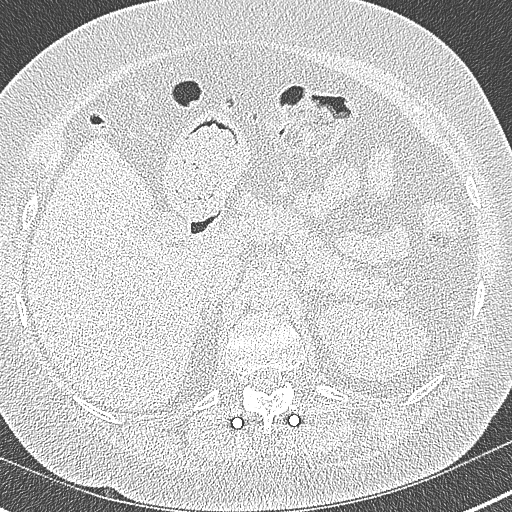
[im 52/307  lung]
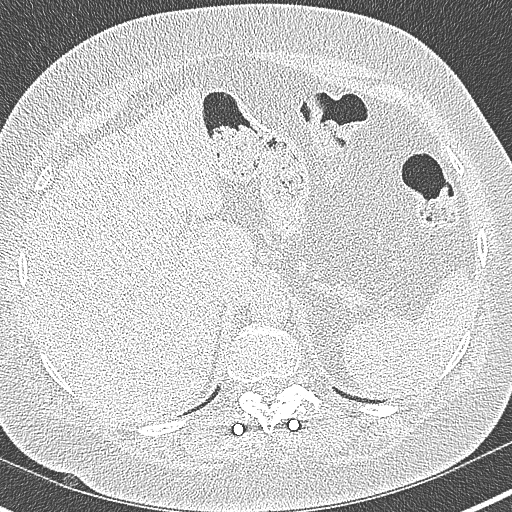
[im 77/307  lung]
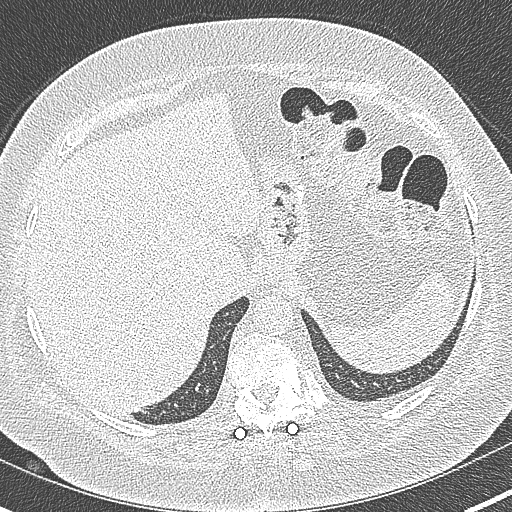
[im 103/307  lung]
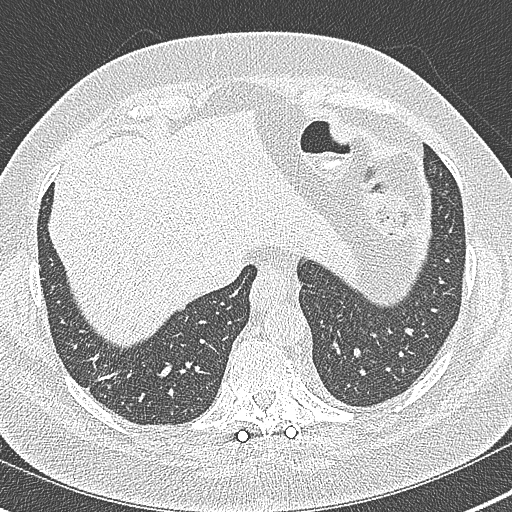
[im 128/307  mediastinal]
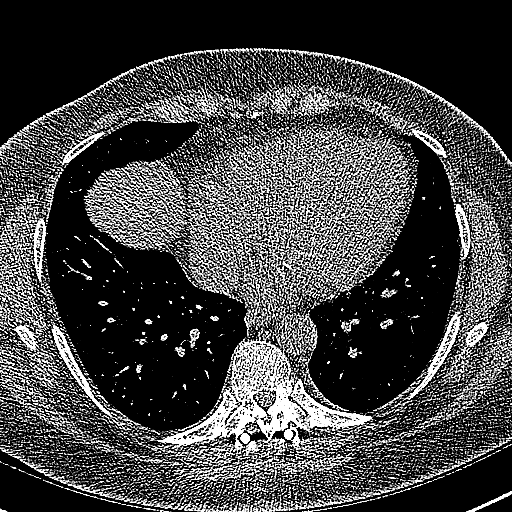
[im 128/307  lung]
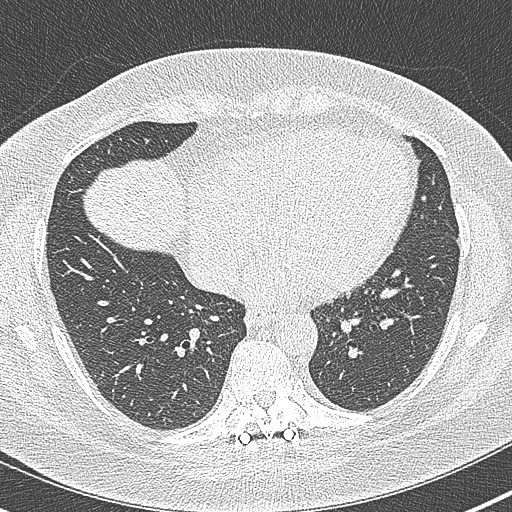
[im 154/307  lung]
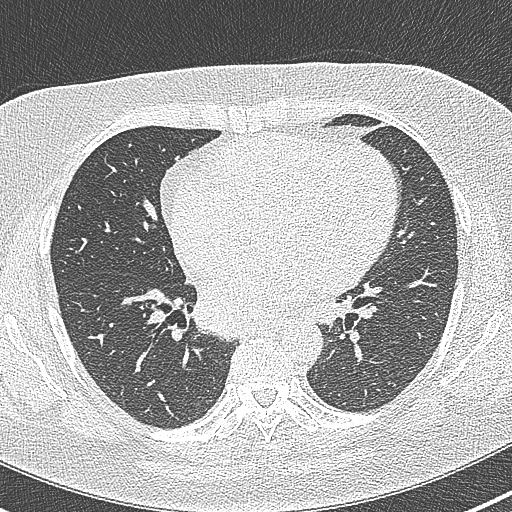
[im 179/307  lung]
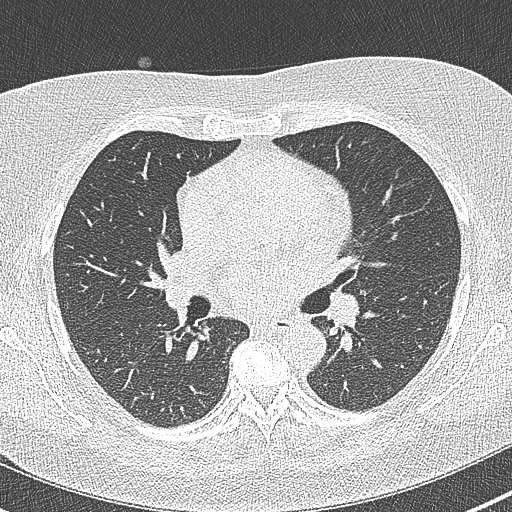
[im 205/307  lung]
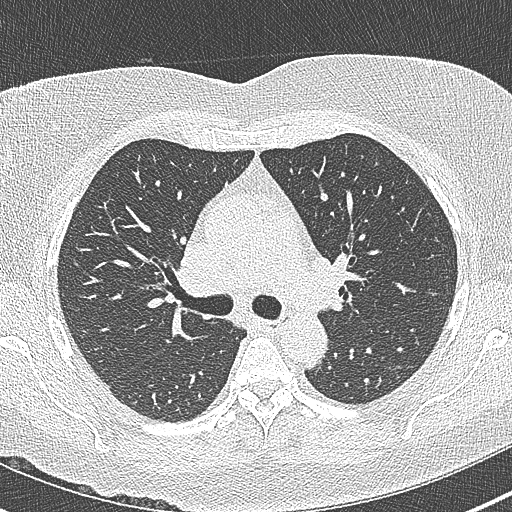
[im 230/307  mediastinal]
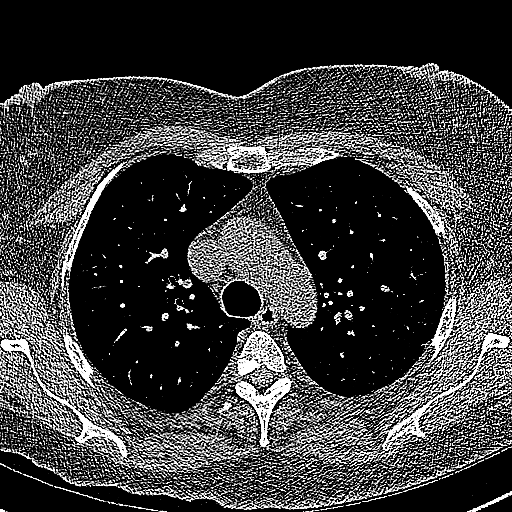
[im 230/307  lung]
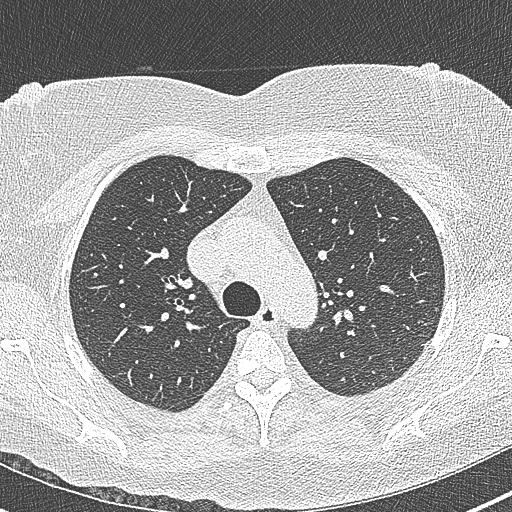
[im 256/307  lung]
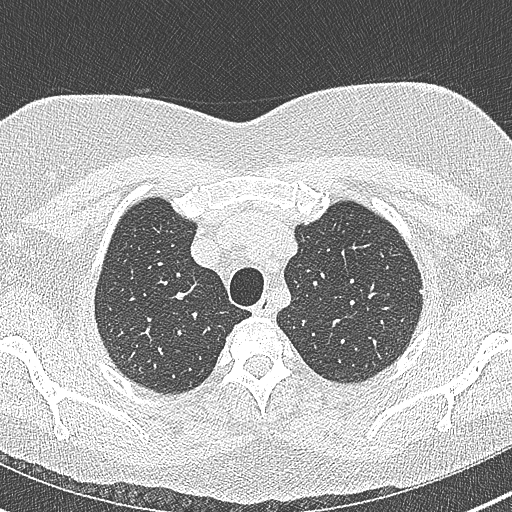
[im 281/307  lung]
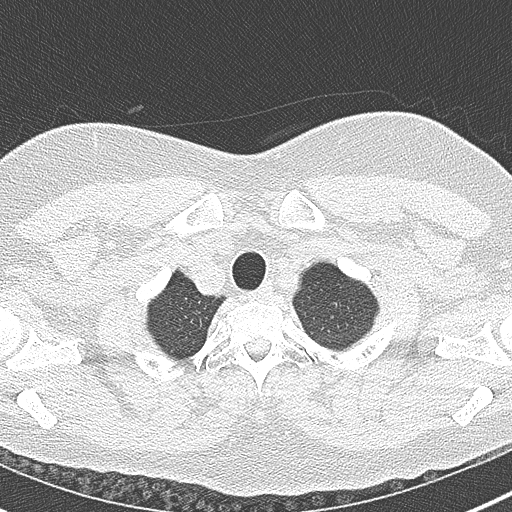

[Series 8: coronal · coronal · 0.63mm/px · 3 of 144 slices shown]
[im 29/144  lung]
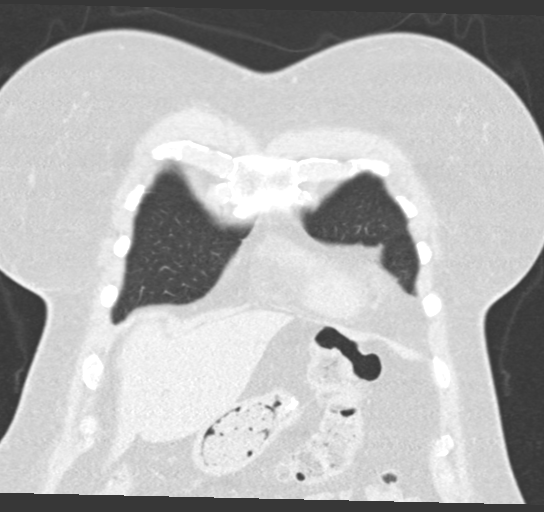
[im 58/144  lung]
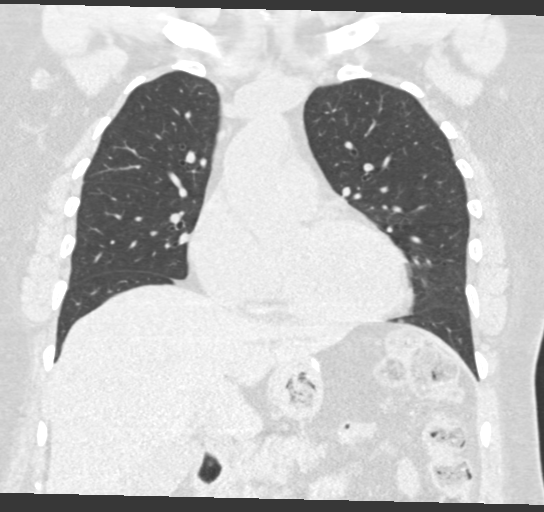
[im 86/144  lung]
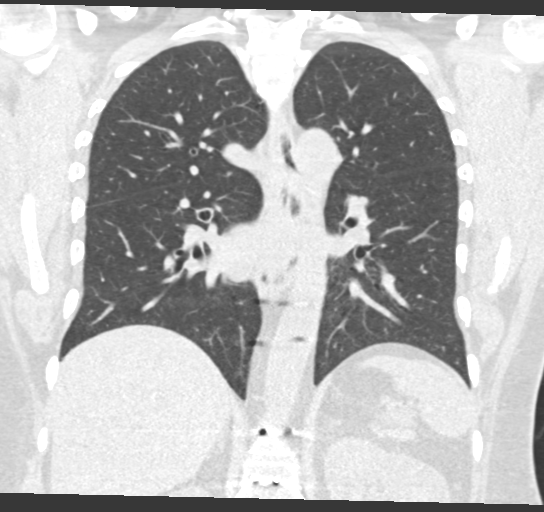

[14 of 36 positions shown; findings below may reference images not displayed]

FINDINGS: Cardiovascular: Heart is enlarged.  No pericardial effusion.

Mediastinum/Nodes: No pathologically enlarged mediastinal or
axillary lymph nodes. Hilar regions are difficult to evaluate
without IV contrast but appear grossly unremarkable. Esophagus is
grossly unremarkable.

Lungs/Pleura: Negative for subpleural reticulation, traction
bronchiectasis/bronchiolectasis, ground-glass, architectural
distortion or honeycombing. No air trapping. Calcified granulomas.
Wedge resection in the right lower lobe. Scattered noncalcified
pulmonary nodules measure up to 5 mm in the subpleural anterior
segment right upper lobe (5/47). No pleural fluid. Airway is
unremarkable.

Upper Abdomen: Visualized portions of the liver, gallbladder,
adrenal glands, kidneys, spleen and pancreas are unremarkable.
Sleeve gastrectomy. Small hiatal hernia.

Musculoskeletal: Degenerative and postoperative changes in the
spine. Scattered sclerotic lesions in the visualized osseous
structures.
IMPRESSION: 1. No evidence of interstitial lung disease.
2. Scattered sclerotic osseous lesions are worrisome for metastatic
disease. A site of primary malignancy is not identified in the
chest.
3. Noncalcified pulmonary nodules measure 5 mm or less in size and
are indeterminate. Recommend continued attention on follow-up exams
as metastatic disease cannot be definitively excluded.

## 2023-05-15 ENCOUNTER — Telehealth: Payer: Self-pay | Admitting: Pulmonary Disease

## 2023-05-15 DIAGNOSIS — J849 Interstitial pulmonary disease, unspecified: Secondary | ICD-10-CM

## 2023-05-15 NOTE — Telephone Encounter (Signed)
 Patient needs blood work to be sent to [facility]. It was sent to [facility]. Please call when completed [phone]

## 2023-05-18 ENCOUNTER — Other Ambulatory Visit: Payer: Self-pay

## 2023-05-18 NOTE — Telephone Encounter (Signed)
 Orders sent to [facility], pt notified nfn

## 2023-05-18 NOTE — Telephone Encounter (Signed)
 Patient checking on message for labwork. Needs to go to [facility]. Patient phone number is [phone].

## 2023-05-24 ENCOUNTER — Emergency Department (HOSPITAL_BASED_OUTPATIENT_CLINIC_OR_DEPARTMENT_OTHER): Payer: Medicare HMO

## 2023-05-24 ENCOUNTER — Emergency Department (HOSPITAL_BASED_OUTPATIENT_CLINIC_OR_DEPARTMENT_OTHER)
Admission: EM | Admit: 2023-05-24 | Discharge: 2023-05-24 | Disposition: A | Discharge status: Home / Self Care | Payer: Medicare HMO | Attending: Emergency Medicine | Admitting: Emergency Medicine

## 2023-05-24 ENCOUNTER — Other Ambulatory Visit: Payer: Self-pay

## 2023-05-24 ENCOUNTER — Encounter (HOSPITAL_BASED_OUTPATIENT_CLINIC_OR_DEPARTMENT_OTHER): Payer: Self-pay | Admitting: Emergency Medicine

## 2023-05-24 DIAGNOSIS — M47816 Spondylosis without myelopathy or radiculopathy, lumbar region: Secondary | ICD-10-CM | POA: Diagnosis not present

## 2023-05-24 DIAGNOSIS — S3992XA Unspecified injury of lower back, initial encounter: Secondary | ICD-10-CM | POA: Diagnosis present

## 2023-05-24 DIAGNOSIS — I10 Essential (primary) hypertension: Secondary | ICD-10-CM | POA: Insufficient documentation

## 2023-05-24 DIAGNOSIS — M549 Dorsalgia, unspecified: Secondary | ICD-10-CM | POA: Diagnosis not present

## 2023-05-24 DIAGNOSIS — M4804 Spinal stenosis, thoracic region: Secondary | ICD-10-CM | POA: Diagnosis not present

## 2023-05-24 DIAGNOSIS — M47814 Spondylosis without myelopathy or radiculopathy, thoracic region: Secondary | ICD-10-CM | POA: Diagnosis not present

## 2023-05-24 DIAGNOSIS — W19XXXA Unspecified fall, initial encounter: Secondary | ICD-10-CM

## 2023-05-24 DIAGNOSIS — W000XXA Fall on same level due to ice and snow, initial encounter: Secondary | ICD-10-CM | POA: Insufficient documentation

## 2023-05-24 DIAGNOSIS — S300XXA Contusion of lower back and pelvis, initial encounter: Secondary | ICD-10-CM | POA: Insufficient documentation

## 2023-05-24 DIAGNOSIS — M4807 Spinal stenosis, lumbosacral region: Secondary | ICD-10-CM | POA: Diagnosis not present

## 2023-05-24 DIAGNOSIS — M48061 Spinal stenosis, lumbar region without neurogenic claudication: Secondary | ICD-10-CM | POA: Diagnosis not present

## 2023-05-24 DIAGNOSIS — Z79899 Other long term (current) drug therapy: Secondary | ICD-10-CM | POA: Diagnosis not present

## 2023-05-24 DIAGNOSIS — R918 Other nonspecific abnormal finding of lung field: Secondary | ICD-10-CM | POA: Diagnosis not present

## 2023-05-24 DIAGNOSIS — R0781 Pleurodynia: Secondary | ICD-10-CM | POA: Diagnosis not present

## 2023-05-24 MED ORDER — IBUPROFEN 600 MG PO TABS: 600.0000 mg | ORAL_TABLET | Freq: Four times a day (QID) | ORAL | 0 refills | Status: DC | PRN

## 2023-05-24 MED ORDER — CYCLOBENZAPRINE HCL 10 MG PO TABS: 10.0000 mg | ORAL_TABLET | Freq: Two times a day (BID) | ORAL | 0 refills | Status: DC | PRN

## 2023-05-24 MED ORDER — OXYCODONE HCL 5 MG PO TABS: 5.0000 mg | ORAL_TABLET | ORAL | 0 refills | Status: DC | PRN

## 2023-05-24 MED ORDER — LIDOCAINE 5 % EX PTCH
1.0000 | MEDICATED_PATCH | Freq: Once | CUTANEOUS | Status: DC
  Administered 2023-05-24: 1 via TRANSDERMAL
  Filled 2023-05-24: qty 1

## 2023-05-24 MED ORDER — OXYCODONE HCL 5 MG PO TABS
5.0000 mg | ORAL_TABLET | Freq: Once | ORAL | Status: AC
  Administered 2023-05-24: 5 mg via ORAL
  Filled 2023-05-24: qty 1

## 2023-05-24 MED ORDER — ACETAMINOPHEN 325 MG PO TABS: 650.0000 mg | ORAL_TABLET | Freq: Four times a day (QID) | ORAL | 0 refills | Status: DC | PRN

## 2023-05-24 MED ORDER — ACETAMINOPHEN 500 MG PO TABS
1000.0000 mg | ORAL_TABLET | Freq: Once | ORAL | Status: AC
  Administered 2023-05-24: 1000 mg via ORAL
  Filled 2023-05-24: qty 2

## 2023-05-24 NOTE — ED Notes (Signed)
Patient transported to CT via WC

## 2023-05-24 NOTE — ED Notes (Signed)
 ED Provider at bedside.

## 2023-05-24 NOTE — ED Triage Notes (Addendum)
 States fell in the [employer] parking lot and landed on her right side. Pain to lower and mid back. No LOC . Took tylenol 650mg  at 0930

## 2023-05-24 NOTE — ED Provider Notes (Signed)
 [facility] Provider Note  CSN: [id number] Arrival date & time: 05/24/23 1011  Chief Complaint(s) Fall  HPI Caitlin White is a 58 y.o. female with past medical history as below, significant for obesity, hysterectomy, IDA, OSA, sciatica, RLS, osteoarthritis who presents to the ED with complaint of fall, back pain  She reports that she slipped on ice last night and fell backwards onto her backside.  Hit her tailbone and her mid back, no head injury or LOC, no thinners.  She was ambulatory after the fall.  She went home took some pain medicine but difficulty sleeping.  Reports ongoing pain today.  No headache, nausea or vomiting, no numbness or weakness to extremities, no change to bowel or bladder function, no numbness to her groin  Past Medical History Past Medical History:  Diagnosis Date   Arthritis    Body mass index (BMI) 36.0-36.9, adult 04/07/2023   Bone lesion 04/30/2021   Chronic pain    Dependence on other enabling machines and devices 04/07/2023   Disorder of thyroid  gland 04/07/2023   Excessive daytime sleepiness 02/05/2022   Failed back surgical syndrome 02/05/2022   Gastroesophageal reflux disease 04/07/2023   H/O abdominal supracervical subtotal hysterectomy 04/17/2014   H/O bilateral breast reduction surgery 12/26/2015   Formatting of this note might be different from the original. 2001     History of restless legs syndrome 02/05/2022   Hypertension    Insomnia secondary to chronic pain 02/05/2022   Interstitial lung disease (HCC)    Iron deficiency anemia 04/07/2023   Obstructive sleep apnea 04/07/2023   Pap smear abnormality of cervix with ASCUS favoring dysplasia 04/17/2014   Pneumonia    Poor compliance with CPAP treatment 06/30/2022   Poor sleep hygiene 06/30/2022   Post-surgical hypothyroidism 04/07/2023   Primary osteoarthritis, unspecified ankle and foot 04/07/2023   Restless legs syndrome 04/07/2023    Sciatica    Snoring 06/30/2022   Status post complete thyroidectomy 02/05/2022   Patient Active Problem List   Diagnosis Date Noted   Body mass index (BMI) 36.0-36.9, adult 04/07/2023   Dependence on other enabling machines and devices 04/07/2023   Gastroesophageal reflux disease 04/07/2023   Iron deficiency anemia 04/07/2023   Obstructive sleep apnea 04/07/2023   Post-surgical hypothyroidism 04/07/2023   Primary osteoarthritis, unspecified ankle and foot 04/07/2023   Restless legs syndrome 04/07/2023   Disorder of thyroid  gland 04/07/2023   Arthritis    Chronic pain    Hypertension    Interstitial lung disease (HCC)    Pneumonia    Sciatica    Poor sleep hygiene 06/30/2022   Snoring 06/30/2022   Poor compliance with CPAP treatment 06/30/2022   Insomnia secondary to chronic pain 02/05/2022   Excessive daytime sleepiness 02/05/2022   History of restless legs syndrome 02/05/2022   Failed back surgical syndrome 02/05/2022   Status post complete thyroidectomy 02/05/2022   Bone lesion 04/30/2021   H/O bilateral breast reduction surgery 12/26/2015   H/O abdominal supracervical subtotal hysterectomy 04/17/2014   Pap smear abnormality of cervix with ASCUS favoring dysplasia 04/17/2014   Home Medication(s) Prior to Admission medications   Medication Sig Start Date End Date Taking? Authorizing Provider  acetaminophen  (TYLENOL ) 325 MG tablet Take 2 tablets (650 mg total) by mouth every 6 (six) hours as needed. 05/24/23  Yes Elnor Jayson LABOR, DO  cyclobenzaprine  (FLEXERIL ) 10 MG tablet Take 1 tablet (10 mg total) by mouth 2 (two) times daily as needed for muscle spasms.  05/24/23  Yes Elnor Jayson LABOR, DO  ibuprofen  (ADVIL ) 600 MG tablet Take 1 tablet (600 mg total) by mouth every 6 (six) hours as needed. 05/24/23  Yes Elnor Jayson A, DO  oxyCODONE  (ROXICODONE ) 5 MG immediate release tablet Take 1 tablet (5 mg total) by mouth every 4 (four) hours as needed. 05/24/23  Yes Elnor Jayson A, DO   acetaminophen  (TYLENOL ) 500 MG tablet Take 500 mg by mouth every 6 (six) hours as needed for mild pain (pain score 1-3) or moderate pain (pain score 4-6).    [provider]  clobetasol ointment (TEMOVATE) 0.05 % Apply 1 Application topically daily. 01/30/23   [provider]  levothyroxine  (SYNTHROID ) 200 MCG tablet Take 200 mcg by mouth daily. 03/13/21   [provider]  losartan -hydrochlorothiazide (HYZAAR) 100-12.5 MG tablet Take 1 tablet by mouth daily. 03/28/21   [provider]  methylPREDNISolone  (MEDROL  DOSEPAK) 4 MG TBPK tablet 6 day dose pack - take as directed 04/13/23   Silva Juliene SAUNDERS, DPM  Multiple Vitamins-Minerals (CENTRUM ADULT PO) Take 1 tablet by mouth daily.    [provider]  omeprazole  (PRILOSEC) 20 MG capsule Take 20 mg by mouth daily.    [provider]  potassium chloride  (KLOR-CON  M) 10 MEQ tablet TAKE 1 TABLET BY MOUTH EVERY DAY 01/26/23   Jason Leita Repine, FNP  predniSONE  (DELTASONE ) 10 MG tablet Take 2 tablets (20 mg total) by mouth daily with breakfast. 04/30/23 04/29/24  Mannam, Praveen, MD  VITAMIN D  PO Take 1 tablet by mouth daily.    [provider]  Vitamin D , Ergocalciferol , (DRISDOL ) 1.25 MG (50000 UNIT) CAPS capsule Take 50,000 Units by mouth once a week. 01/12/23   [provider]                                                                                                                                    Past Surgical History Past Surgical History:  Procedure Laterality Date   ABDOMINAL HYSTERECTOMY     BACK SURGERY     Family History History reviewed. No pertinent family history.  Social History Social History   Tobacco Use   Smoking status: Never    Passive exposure: Never   Smokeless tobacco: Never  Vaping Use   Vaping status: Never Used  Substance Use Topics   Alcohol use: Never   Drug use: Never   Allergies Patient has no known allergies.  Review of  Systems Review of Systems  Constitutional:  Negative for fatigue.  HENT:  Negative for congestion.   Respiratory:  Negative for chest tightness and shortness of breath.   Cardiovascular:  Negative for chest pain.  Gastrointestinal:  Negative for abdominal pain, nausea and vomiting.  Genitourinary:  Negative for difficulty urinating and dysuria.  Musculoskeletal:  Positive for arthralgias and back pain.  Neurological:  Negative for syncope and headaches.  All other systems reviewed and are negative.  Physical Exam Vital Signs  I have reviewed the triage vital signs BP (!) 162/97 (BP Location: Right Arm)   Pulse 77   Temp 98.3 F (36.8 C) (Oral)   Resp 18   Ht 6' 1 (1.854 m)   Wt 117.9 kg   SpO2 99%   BMI 34.30 kg/m  Physical Exam Vitals and nursing note reviewed.  Constitutional:      General: She is not in acute distress.    Appearance: Normal appearance. She is well-developed. She is not ill-appearing.  HENT:     Head: Normocephalic and atraumatic.     Right Ear: External ear normal.     Left Ear: External ear normal.     Nose: Nose normal.     Mouth/Throat:     Mouth: Mucous membranes are moist.  Eyes:     General: No scleral icterus.       Right eye: No discharge.        Left eye: No discharge.  Cardiovascular:     Rate and Rhythm: Normal rate.  Pulmonary:     Effort: Pulmonary effort is normal. No respiratory distress.     Breath sounds: No stridor.  Abdominal:     General: Abdomen is flat. There is no distension.     Palpations: Abdomen is soft.     Tenderness: There is no abdominal tenderness. There is no guarding.  Musculoskeletal:        General: No deformity.       Arms:     Cervical back: No rigidity.  Skin:    General: Skin is warm and dry.     Coloration: Skin is not cyanotic, jaundiced or pale.  Neurological:     Mental Status: She is alert and oriented to person, place, and time.     GCS: GCS eye subscore is 4. GCS verbal subscore is 5. GCS  motor subscore is 6.     Motor: Motor function is intact.     Gait: Gait abnormal (antalgic gait).     Comments: Strength 5/5 to BLUE/BLLE, equal and symmetric   Sensation intact b/l le  LE NVI  Psychiatric:        Speech: Speech normal.        Behavior: Behavior normal. Behavior is cooperative.     ED Results and Treatments Labs (all labs ordered are listed, but only abnormal results are displayed) Labs Reviewed - No data to display                                                                                                                        Radiology CT Lumbar Spine Wo Contrast Result Date: 05/24/2023 CLINICAL DATA:  58 year old female status post fell in parking lot with right posterior back and rib pain. History of multifocal sclerotic bone lesions, biopsied with CT guidance in 2023. EXAM: CT LUMBAR SPINE WITHOUT CONTRAST TECHNIQUE: Multidetector CT imaging of the lumbar spine was performed without intravenous contrast administration. Multiplanar CT  image reconstructions were also generated. RADIATION DOSE REDUCTION: This exam was performed according to the departmental dose-optimization program which includes automated exposure control, adjustment of the mA and/or kV according to patient size and/or use of iterative reconstruction technique. COMPARISON:  CT thoracic spine today. CT Abdomen and Pelvis 02/24/2021. FINDINGS: Segmentation: Normal, concordant with the thoracic numbering today. Alignment: Stable lumbar lordosis since 2022. Subtle chronic anterolisthesis of L4 on L5. No significant scoliosis. Vertebrae: Postoperative details are below. Stable lumbar vertebral height since 2022. Chronic widely scattered sclerotic vertebral bone heterogeneity also appears stable since 2022. Lower thoracic levels are detailed separately. No lumbar vertebral fracture identified. Visible sacrum and SI joints also appear stable (degenerated) intact. Paraspinal and other soft tissues: Negative  visible noncontrast abdominal viscera aside from retained stool. Chronic postoperative changes to the lumbar paraspinal soft tissues with no adverse features. Disc levels: Chronic lower thoracic through L1 bilateral pedicle and spinal rod fusion with solid T12-L1 posterior element arthrodesis. Mild for age L1-L2 and L2-L3 lumbar disc degeneration but moderate chronic facet hypertrophy, stable since 2022. L3-L4 circumferential disc bulging plus moderate facet and ligament flavum hypertrophy. Vacuum facet on the left. Moderate chronic spinal stenosis suspected here, stable. Only mild L3 foraminal stenosis suspected. L4-L5: Chronic mild grade 1 anterolisthesis with severe disc space loss, vacuum disc, bulky disc osteophyte degeneration and bulky facet hypertrophy. Mild to moderate chronic spinal and L4 foraminal stenosis appears stable since 2022. L5-S1: Circumferential disc bulge, endplate spurring, and moderate to severe facet hypertrophy greater on the right. No significant spinal stenosis. Moderate to severe right L5 foraminal stenosis appears stable. IMPRESSION: 1. No acute traumatic injury identified in the Lumbar Spine. 2. Chronic lower thoracic through L1 posterior fusion with solid posterior element arthrodesis. Chronic sclerotic vertebral heterogeneity stable since 2022, and previously biopsied. 3. Chronic bulky lumbar spine degeneration L3-L4 through L5-S1 is stable, with up to moderate spinal stenosis at L3-L4 and L4-L5, moderate to severe neural foraminal stenosis at the right L5 nerve level. Electronically Signed   By: VEAR Hurst M.D.   On: 05/24/2023 13:06   CT Thoracic Spine Wo Contrast Result Date: 05/24/2023 CLINICAL DATA:  58 year old female status post fell in parking lot with right posterior back and rib pain. History of multifocal sclerotic bone lesions, biopsied with CT guidance in 2023. EXAM: CT THORACIC SPINE WITHOUT CONTRAST TECHNIQUE: Multidetector CT images of the thoracic were obtained  using the standard protocol without intravenous contrast. RADIATION DOSE REDUCTION: This exam was performed according to the departmental dose-optimization program which includes automated exposure control, adjustment of the mA and/or kV according to patient size and/or use of iterative reconstruction technique. COMPARISON:  High-resolution chest CT 04/08/2023. FINDINGS: Limited cervical spine imaging: Cervicothoracic junction alignment is within normal limits. Thoracic spine segmentation:  Normal. Alignment: Stable thoracic kyphosis since last year. No significant scoliosis. Vertebrae: Postoperative details are below. Stable thoracic vertebral height. Chronic heterogeneous bone mineralization, stable since last year. No acute thoracic vertebral fracture identified. Chronic fracture of the left posterior 11th costovertebral junction appears stable from last year. No acute posterior rib fracture identified. Paraspinal and other soft tissues: Major airways are patent. Scattered chronic lower lobe pulmonary ground-glass opacity not significantly changed since November when allowing for lower lung volumes today. No evidence of pericardial or pleural effusion. Stable visible noncontrast mediastinum. Chronic postoperative changes to the gastroesophageal junction appears stable. Stable postoperative changes to the lower thoracic paraspinal soft tissues. Disc levels: Thoracic levels T1-T2 through T6-T7 appears stable and negative. Chronic T9  through upper lumbar posterior spinal fusion hardware is stable. Heterogeneous and sclerotic thoracic posterior element ankylosis/arthrodesis from T8-T9 through the thoracolumbar junction appears stable. Chronic bone cement suspected along the left lateral T11 vertebra corresponding to site of chronic costovertebral junction fracture there. Chronic adjacent segment disease at T7-T8 with facet arthropathy greater on the right, chronic right T7 neural foraminal stenosis which is moderate  to severe. No CT evidence of thoracic spinal stenosis. Lumbar levels are detailed separately today. IMPRESSION: 1. No acute traumatic injury identified in the Thoracic Spine. 2. Stable since last year chronic nonspecific thoracic vertebral sclerotic lesions (previously biopsied in 2023), chronic T9 through upper lumbar posterior spinal fusion, and adjacent segment degeneration at T7-T8 with up to severe right T7 nerve level foraminal stenosis. No acute posterior rib fracture identified. Chronic fracture of the left 11th costovertebral junction. 3. Chronic nonspecific lower lung ground-glass opacity not significantly stable when allowing for lower lung volumes today. Electronically Signed   By: VEAR Hurst M.D.   On: 05/24/2023 13:00   DG Ribs Unilateral W/Chest Right Result Date: 05/24/2023 CLINICAL DATA:  Fall.  Right rib pain. EXAM: RIGHT RIBS AND CHEST - 3+ VIEW COMPARISON:  None Available. FINDINGS: No fracture or other bone lesions are seen involving the ribs. There is no evidence of pneumothorax or pleural effusion. Both lungs are clear. Heart size and mediastinal contours are within normal limits. Thoracolumbar spine posterior fixation rods again noted. IMPRESSION: Negative. Electronically Signed   By: Norleen DELENA Kil M.D.   On: 05/24/2023 12:10    Pertinent labs & imaging results that were available during my care of the patient were reviewed by me and considered in my medical decision making (see MDM for details).  Medications Ordered in ED Medications  lidocaine  (LIDODERM ) 5 % 1 patch (1 patch Transdermal Patch Applied 05/24/23 1221)  oxyCODONE  (Oxy IR/ROXICODONE ) immediate release tablet 5 mg (5 mg Oral Given 05/24/23 1221)  acetaminophen  (TYLENOL ) tablet 1,000 mg (1,000 mg Oral Given 05/24/23 1221)                                                                                                                                     Procedures Procedures  (including critical care time)  Medical  Decision Making / ED Course    Medical Decision Making:    Ceaira Ernster is a 58 y.o. female with past medical history as below, significant for obesity, hysterectomy, IDA, OSA, sciatica, RLS, osteoarthritis who presents to the ED with complaint of fall, back pain. The complaint involves an extensive differential diagnosis and also carries with it a high risk of complications and morbidity.  Serious etiology was considered. Ddx includes but is not limited to: Differential diagnosis includes but is not exclusive to musculoskeletal back pain, renal colic, urinary tract infection, pyelonephritis, intra-abdominal causes of back pain, aortic aneurysm or dissection, cauda equina syndrome, sciatica, lumbar disc disease, thoracic disc disease, etc.  Complete initial physical exam performed, notably the patient was in no distress, neuroexam is nonfocal.  She has midline TTP back..    Reviewed and confirmed nursing documentation for past medical history, family history, social history.  Vital signs reviewed.      Clinical Course as of 05/24/23 1315  Sun May 24, 2023  1215 Rib series xr wnl [SG]  1235 Feeling better, ambulatory  [SG]    Clinical Course User Index [SG] Elnor Jayson LABOR, DO    Brief summary: 58 year old female history as above here with slip and fall last night, back pain.  She has midline TTP and some right posterior chest wall/rib TTP.  No crepitance.  No dyspnea or hypoxia.  No step-off deformity.  She is ambulatory.  LE NVI.  No saddle paresthesia.  Will get imaging, analgesia.  Imaging reviewed, this is stable.  Feeling better after analgesia.  She is press photographer.  Exam is reassuring, she is neurologically intact.  Patient presents with low back pain without signs of spinal cord compression, cauda equina syndrome, infection, aneurysm, or other serious etiology. The patient is neurologically intact. Given the extremely low risk of these diagnoses further testing and evaluation for  these possibilities does not appear to be indicated at this time. Detailed discussions were had with the patient and/or family and caregivers, regarding current findings, and need for close f/u with PCP or on call doctor. The patient has been instructed to return immediately if the symptoms worsen in any way. Patient verbalized understanding and is in agreement with current care plan. All questions answered prior to discharge.            Additional history obtained: -Additional history obtained from na -External records from outside source obtained and reviewed including: Chart review including previous notes, labs, imaging, consultation notes including  Prior labs and imaging, medications   Lab Tests: na  EKG   EKG Interpretation Date/Time:    Ventricular Rate:    PR Interval:    QRS Duration:    QT Interval:    QTC Calculation:   R Axis:      Text Interpretation:           Imaging Studies ordered: I ordered imaging studies including CT thoracic and lumbar, rib series right x-ray I independently visualized the following imaging with scope of interpretation limited to determining acute life threatening conditions related to emergency care; findings noted above I independently visualized and interpreted imaging. I agree with the radiologist interpretation   Medicines ordered and prescription drug management: Meds ordered this encounter  Medications   lidocaine  (LIDODERM ) 5 % 1 patch   oxyCODONE  (Oxy IR/ROXICODONE ) immediate release tablet 5 mg    Refill:  0   acetaminophen  (TYLENOL ) tablet 1,000 mg   cyclobenzaprine  (FLEXERIL ) 10 MG tablet    Sig: Take 1 tablet (10 mg total) by mouth 2 (two) times daily as needed for muscle spasms.    Dispense:  20 tablet    Refill:  0   ibuprofen  (ADVIL ) 600 MG tablet    Sig: Take 1 tablet (600 mg total) by mouth every 6 (six) hours as needed.    Dispense:  30 tablet    Refill:  0   acetaminophen  (TYLENOL ) 325 MG tablet     Sig: Take 2 tablets (650 mg total) by mouth every 6 (six) hours as needed.    Dispense:  36 tablet    Refill:  0   oxyCODONE  (ROXICODONE ) 5 MG immediate release tablet  Sig: Take 1 tablet (5 mg total) by mouth every 4 (four) hours as needed.    Dispense:  5 tablet    Refill:  0    -I have reviewed the patients home medicines and have made adjustments as needed   Consultations Obtained: na   Cardiac Monitoring: Continuous pulse oximetry interpreted by myself, 100% on RA.    Social Determinants of Health:  Diagnosis or treatment significantly limited by social determinants of health: obesity   Reevaluation: After the interventions noted above, I reevaluated the patient and found that they have improved  Co morbidities that complicate the patient evaluation  Past Medical History:  Diagnosis Date   Arthritis    Body mass index (BMI) 36.0-36.9, adult 04/07/2023   Bone lesion 04/30/2021   Chronic pain    Dependence on other enabling machines and devices 04/07/2023   Disorder of thyroid  gland 04/07/2023   Excessive daytime sleepiness 02/05/2022   Failed back surgical syndrome 02/05/2022   Gastroesophageal reflux disease 04/07/2023   H/O abdominal supracervical subtotal hysterectomy 04/17/2014   H/O bilateral breast reduction surgery 12/26/2015   Formatting of this note might be different from the original. 2001     History of restless legs syndrome 02/05/2022   Hypertension    Insomnia secondary to chronic pain 02/05/2022   Interstitial lung disease (HCC)    Iron deficiency anemia 04/07/2023   Obstructive sleep apnea 04/07/2023   Pap smear abnormality of cervix with ASCUS favoring dysplasia 04/17/2014   Pneumonia    Poor compliance with CPAP treatment 06/30/2022   Poor sleep hygiene 06/30/2022   Post-surgical hypothyroidism 04/07/2023   Primary osteoarthritis, unspecified ankle and foot 04/07/2023   Restless legs syndrome 04/07/2023   Sciatica    Snoring 06/30/2022    Status post complete thyroidectomy 02/05/2022      Dispostion: Disposition decision including need for hospitalization was considered, and patient discharged from emergency department.    Final Clinical Impression(s) / ED Diagnoses Final diagnoses:  Fall, initial encounter  Contusion of lower back, initial encounter        Elnor Jayson LABOR, DO 05/24/23 1315

## 2023-05-24 NOTE — Discharge Instructions (Addendum)
 It was a pleasure caring for you today in the emergency department.  Please return to the emergency department for any worsening or worrisome symptoms.

## 2023-06-16 ENCOUNTER — Ambulatory Visit: Payer: Medicare HMO | Admitting: Podiatry

## 2023-07-03 ENCOUNTER — Ambulatory Visit (INDEPENDENT_AMBULATORY_CARE_PROVIDER_SITE_OTHER): Payer: Medicare HMO | Admitting: Physician Assistant

## 2023-07-03 ENCOUNTER — Ambulatory Visit (HOSPITAL_BASED_OUTPATIENT_CLINIC_OR_DEPARTMENT_OTHER)
Admission: RE | Admit: 2023-07-03 | Discharge: 2023-07-03 | Disposition: A | Discharge status: Home / Self Care | Payer: Medicare HMO | Source: Ambulatory Visit | Attending: Physician Assistant | Admitting: Physician Assistant

## 2023-07-03 ENCOUNTER — Ambulatory Visit: Payer: Medicare HMO | Admitting: Family

## 2023-07-03 VITALS — BP 163/92 | HR 83 | Temp 98.3°F | Ht 73.0 in | Wt 267.2 lb

## 2023-07-03 DIAGNOSIS — I10 Essential (primary) hypertension: Secondary | ICD-10-CM | POA: Diagnosis not present

## 2023-07-03 DIAGNOSIS — K219 Gastro-esophageal reflux disease without esophagitis: Secondary | ICD-10-CM | POA: Diagnosis not present

## 2023-07-03 DIAGNOSIS — D509 Iron deficiency anemia, unspecified: Secondary | ICD-10-CM | POA: Diagnosis not present

## 2023-07-03 DIAGNOSIS — R7303 Prediabetes: Secondary | ICD-10-CM

## 2023-07-03 DIAGNOSIS — R059 Cough, unspecified: Secondary | ICD-10-CM | POA: Diagnosis not present

## 2023-07-03 DIAGNOSIS — E039 Hypothyroidism, unspecified: Secondary | ICD-10-CM

## 2023-07-03 DIAGNOSIS — J849 Interstitial pulmonary disease, unspecified: Secondary | ICD-10-CM | POA: Diagnosis not present

## 2023-07-03 DIAGNOSIS — L409 Psoriasis, unspecified: Secondary | ICD-10-CM

## 2023-07-03 DIAGNOSIS — Z981 Arthrodesis status: Secondary | ICD-10-CM | POA: Diagnosis not present

## 2023-07-03 MED ORDER — OMEPRAZOLE 20 MG PO CPDR: 20.0000 mg | DELAYED_RELEASE_CAPSULE | Freq: Every day | ORAL | 1 refills | Status: DC

## 2023-07-03 MED ORDER — BENZONATATE 100 MG PO CAPS: 100.0000 mg | ORAL_CAPSULE | Freq: Two times a day (BID) | ORAL | 0 refills | Status: DC | PRN

## 2023-07-03 MED ORDER — LOSARTAN POTASSIUM-HCTZ 100-12.5 MG PO TABS: 1.0000 | ORAL_TABLET | Freq: Every day | ORAL | 1 refills | Status: DC

## 2023-07-03 MED ORDER — PIMECROLIMUS 1 % EX CREA: TOPICAL_CREAM | Freq: Two times a day (BID) | CUTANEOUS | 0 refills | Status: DC

## 2023-07-03 MED ORDER — LEVOTHYROXINE SODIUM 200 MCG PO TABS: 200.0000 ug | ORAL_TABLET | Freq: Every day | ORAL | 1 refills | Status: DC

## 2023-07-03 NOTE — Progress Notes (Signed)
 Established patient visit   Patient: Caitlin White   DOB: 11-09-1965   58 y.o. Female  MRN: [id number] Visit Date: 07/03/2023  Today's healthcare provider: Alfredia Ferguson, PA-C   Chief Complaint  Patient presents with   Cough    Last 2 weeks- comes and goes.   Wants to make sure it is not in lungs. The cold weather has made it worse on the cough   Dry hands    Psoriasis cream she is using is not working and wants to try something different possible;   Constipation    Having some problems with using the bathroom- has been taking laxatives and does want to ruin lining of stomach. Maybe sometimes only BM once a week.   Subjective    Pt reports worsening of her chronic cough, some shortness of breath, but cough is mainly dry.  She also reports she was using a topical steroid on her hands, she has psoriasis and it has not improved. She is looking for something different.  She also reports constipation difficulties, using laxatives, but does not want to harm her GI tract. Medications: Outpatient Medications Prior to Visit  Medication Sig   acetaminophen (TYLENOL) 325 MG tablet Take 2 tablets (650 mg total) by mouth every 6 (six) hours as needed.   acetaminophen (TYLENOL) 500 MG tablet Take 500 mg by mouth every 6 (six) hours as needed for mild pain (pain score 1-3) or moderate pain (pain score 4-6).   clobetasol ointment (TEMOVATE) 0.05 % Apply 1 Application topically daily.   cyclobenzaprine (FLEXERIL) 10 MG tablet Take 1 tablet (10 mg total) by mouth 2 (two) times daily as needed for muscle spasms.   ibuprofen (ADVIL) 600 MG tablet Take 1 tablet (600 mg total) by mouth every 6 (six) hours as needed.   Multiple Vitamins-Minerals (CENTRUM ADULT PO) Take 1 tablet by mouth daily.   oxyCODONE (ROXICODONE) 5 MG immediate release tablet Take 1 tablet (5 mg total) by mouth every 4 (four) hours as needed.   potassium chloride (KLOR-CON M) 10 MEQ tablet TAKE 1 TABLET BY MOUTH EVERY DAY    predniSONE (DELTASONE) 10 MG tablet Take 2 tablets (20 mg total) by mouth daily with breakfast.   VITAMIN D PO Take 1 tablet by mouth daily.   Vitamin D, Ergocalciferol, (DRISDOL) 1.25 MG (50000 UNIT) CAPS capsule Take 50,000 Units by mouth once a week.   [DISCONTINUED] levothyroxine (SYNTHROID) 200 MCG tablet Take 200 mcg by mouth daily.   [DISCONTINUED] losartan-hydrochlorothiazide (HYZAAR) 100-12.5 MG tablet Take 1 tablet by mouth daily.   [DISCONTINUED] methylPREDNISolone (MEDROL DOSEPAK) 4 MG TBPK tablet 6 day dose pack - take as directed   [DISCONTINUED] omeprazole (PRILOSEC) 20 MG capsule Take 20 mg by mouth daily.   No facility-administered medications prior to visit.    Review of Systems  Constitutional:  Negative for fatigue and fever.  Respiratory:  Positive for cough and shortness of breath.   Cardiovascular:  Negative for chest pain and leg swelling.  Gastrointestinal:  Positive for constipation. Negative for abdominal pain.  Skin:  Positive for rash.  Neurological:  Negative for dizziness and headaches.       Objective    BP (!) 163/92   Pulse 83   Temp 98.3 F (36.8 C) (Oral)   Ht 6\' 1"  (1.854 m)   Wt 267 lb 4 oz (121.2 kg)   SpO2 97%   BMI 35.26 kg/m    Physical Exam Constitutional:  General: She is awake.     Appearance: She is well-developed.  HENT:     Head: Normocephalic.  Eyes:     Conjunctiva/sclera: Conjunctivae normal.  Cardiovascular:     Rate and Rhythm: Normal rate.  Pulmonary:     Effort: Pulmonary effort is normal.     Breath sounds: Normal breath sounds. Decreased air movement present.  Skin:    General: Skin is warm.  Neurological:     Mental Status: She is alert and oriented to person, place, and time.  Psychiatric:        Attention and Perception: Attention normal.        Mood and Affect: Mood normal.        Speech: Speech normal.        Behavior: Behavior is cooperative.      Results for orders placed or performed in  visit on 07/03/23  Sjogrens syndrome-A extractable nuclear antibody  Result Value Ref Range   SSA (Ro) (ENA) Antibody, IgG <1.0 NEG <1.0 NEG AI  Sjogrens syndrome-B extractable nuclear antibody  Result Value Ref Range   SSB (La) (ENA) Antibody, IgG <1.0 NEG <1.0 NEG AI  Sjogren's syndrome antibods(ssa + ssb)  Result Value Ref Range   ENA SSA (RO) Ab <0.2 0.0 - 0.9 AI   ENA SSB (LA) Ab <0.2 0.0 - 0.9 AI  Aldolase  Result Value Ref Range   Aldolase 6.1 3.3 - 10.3 U/L  CK (Creatine Kinase)  Result Value Ref Range   Total CK 104 32 - 182 U/L  Anti-scleroderma antibody  Result Value Ref Range   Scleroderma (Scl-70) (ENA) Antibody, IgG <0.2 0.0 - 0.9 AI  Angiotensin converting enzyme  Result Value Ref Range   Angio Convert Enzyme 34 14 - 82 U/L  ANA,IFA RA Diag Pnl w/rflx Tit/Patn  Result Value Ref Range   ANA Titer 1 WILL FOLLOW    Rheumatoid fact SerPl-aCnc <10.0 <14.0 IU/mL   Cyclic Citrullin Peptide Ab 7 0 - 19 units  CBC w/Diff  Result Value Ref Range   WBC 8.3 3.8 - 10.8 Thousand/uL   RBC 3.89 3.80 - 5.10 Million/uL   Hemoglobin 12.2 11.7 - 15.5 g/dL   HCT 16.1 09.6 - 04.5 %   MCV 94.3 80.0 - 100.0 fL   MCH 31.4 27.0 - 33.0 pg   MCHC 33.2 32.0 - 36.0 g/dL   RDW 40.9 81.1 - 91.4 %   Platelets 336 140 - 400 Thousand/uL   MPV 10.3 7.5 - 12.5 fL   Neutro Abs 6,042 1,500 - 7,800 cells/uL   Absolute Lymphocytes 1,818 850 - 3,900 cells/uL   Absolute Monocytes 390 200 - 950 cells/uL   Eosinophils Absolute 17 15 - 500 cells/uL   Basophils Absolute 33 0 - 200 cells/uL   Neutrophils Relative % 72.8 %   Total Lymphocyte 21.9 %   Monocytes Relative 4.7 %   Eosinophils Relative 0.2 %   Basophils Relative 0.4 %  HgB A1c  Result Value Ref Range   Hgb A1c MFr Bld 6.5 (H) <5.7 % of total Hgb   Mean Plasma Glucose 140 mg/dL   eAG (mmol/L) 7.7 mmol/L    Assessment & Plan    Interstitial lung disease (HCC) Pt requesting labs to be done that pulm ordered.  No abnormal breath  sounds, fever. Dry cough. Recommending chest xray to r/o acute pneumonia and f/b with pulm.  Rx tessalon for cough.  -     Sjogrens syndrome-A extractable nuclear antibody -  Sjogrens syndrome-B extractable nuclear antibody -     Sjogren's syndrome antibods(ssa + ssb) -     Aldolase -     CK -     Anti-scleroderma antibody -     Angiotensin converting enzyme -     ANA,IFA RA Diag Pnl w/rflx Tit/Patn -     DG Chest 2 View; Future -     Benzonatate; Take 1 capsule (100 mg total) by mouth 2 (two) times daily as needed.  Dispense: 20 capsule; Refill: 0  Psoriasis Will see if pimecrolimus is covered instead of clobetasol. Recommending f/b with derm -     Pimecrolimus; Apply topically 2 (two) times daily.  Dispense: 30 g; Refill: 0  Gastroesophageal reflux disease, unspecified whether esophagitis present Refilled omeprazole -     Omeprazole; Take 1 capsule (20 mg total) by mouth daily.  Dispense: 90 capsule; Refill: 1  Iron deficiency anemia, unspecified iron deficiency anemia type -     CBC with Differential/Platelet  Prediabetes -     Hemoglobin A1c  Primary hypertension Refilled meds -     Losartan Potassium-HCTZ; Take 1 tablet by mouth daily.  Dispense: 90 tablet; Refill: 1  Hypothyroidism, unspecified type Refilled levothyroxine -     Levothyroxine Sodium; Take 1 tablet (200 mcg total) by mouth daily.  Dispense: 90 tablet; Refill: 1  Constipation Recommending taking a fiber supplement daily.  Return if symptoms worsen or fail to improve.       Alfredia Ferguson, PA-C  [facility] [phone] (phone) [phone] (fax)  [facility]

## 2023-07-04 LAB — CBC WITH DIFFERENTIAL/PLATELET
Absolute Lymphocytes: 1818 {cells}/uL (ref 850–3900)
Absolute Monocytes: 390 {cells}/uL (ref 200–950)
Basophils Absolute: 33 {cells}/uL (ref 0–200)
Basophils Relative: 0.4 %
Eosinophils Absolute: 17 {cells}/uL (ref 15–500)
Eosinophils Relative: 0.2 %
HCT: 36.7 % (ref 35.0–45.0)
Hemoglobin: 12.2 g/dL (ref 11.7–15.5)
MCH: 31.4 pg (ref 27.0–33.0)
MCHC: 33.2 g/dL (ref 32.0–36.0)
MCV: 94.3 fL (ref 80.0–100.0)
MPV: 10.3 fL (ref 7.5–12.5)
Monocytes Relative: 4.7 %
Neutro Abs: 6042 {cells}/uL (ref 1500–7800)
Neutrophils Relative %: 72.8 %
Platelets: 336 10*3/uL (ref 140–400)
RBC: 3.89 10*6/uL (ref 3.80–5.10)
RDW: 12.8 % (ref 11.0–15.0)
Total Lymphocyte: 21.9 %
WBC: 8.3 10*3/uL (ref 3.8–10.8)

## 2023-07-04 LAB — HEMOGLOBIN A1C
Hgb A1c MFr Bld: 6.5 %{Hb} — ABNORMAL HIGH (ref <5.7)
Mean Plasma Glucose: 140 mg/dL
eAG (mmol/L): 7.7 mmol/L

## 2023-07-04 LAB — SJOGRENS SYNDROME-B EXTRACTABLE NUCLEAR ANTIBODY: SSB (La) (ENA) Antibody, IgG: <1 AI

## 2023-07-04 LAB — SJOGRENS SYNDROME-A EXTRACTABLE NUCLEAR ANTIBODY: SSA (Ro) (ENA) Antibody, IgG: <1 AI

## 2023-07-07 ENCOUNTER — Encounter: Payer: Self-pay | Admitting: Physician Assistant

## 2023-07-09 ENCOUNTER — Encounter: Payer: Self-pay | Admitting: Family

## 2023-07-09 ENCOUNTER — Ambulatory Visit (INDEPENDENT_AMBULATORY_CARE_PROVIDER_SITE_OTHER): Payer: Medicare HMO | Admitting: Family

## 2023-07-09 VITALS — BP 132/84 | HR 74 | Ht 73.0 in | Wt 270.0 lb

## 2023-07-09 DIAGNOSIS — E119 Type 2 diabetes mellitus without complications: Secondary | ICD-10-CM

## 2023-07-09 LAB — ANTI-SCLERODERMA ANTIBODY

## 2023-07-09 LAB — ANA,IFA RA DIAG PNL W/RFLX TIT/PATN
Cyclic Citrullin Peptide Ab: 7 U (ref 0–19)
Rheumatoid fact SerPl-aCnc: <10 [IU]/mL (ref <14.0)

## 2023-07-09 LAB — SJOGREN'S SYNDROME ANTIBODS(SSA + SSB)

## 2023-07-09 LAB — CK: Total CK: 104 U/L (ref 32–182)

## 2023-07-09 LAB — ANGIOTENSIN CONVERTING ENZYME: Angio Convert Enzyme: 34 U/L (ref 14–82)

## 2023-07-09 LAB — ALDOLASE: Aldolase: 6.1 U/L (ref 3.3–10.3)

## 2023-07-09 NOTE — Progress Notes (Signed)
 Caitlin White is a 58 y.o. female with the following history as recorded in EpicCare:  Patient Active Problem List   Diagnosis Date Noted   Body mass index (BMI) 36.0-36.9, adult 04/07/2023   Dependence on other enabling machines and devices 04/07/2023   Gastroesophageal reflux disease 04/07/2023   Iron deficiency anemia 04/07/2023   Obstructive sleep apnea 04/07/2023   Post-surgical hypothyroidism 04/07/2023   Primary osteoarthritis, unspecified ankle and foot 04/07/2023   Restless legs syndrome 04/07/2023   Disorder of thyroid gland 04/07/2023   Arthritis    Chronic pain    Hypertension    Interstitial lung disease (HCC)    Pneumonia    Sciatica    Poor sleep hygiene 06/30/2022   Snoring 06/30/2022   Poor compliance with CPAP treatment 06/30/2022   Insomnia secondary to chronic pain 02/05/2022   Excessive daytime sleepiness 02/05/2022   History of restless legs syndrome 02/05/2022   Failed back surgical syndrome 02/05/2022   Status post complete thyroidectomy 02/05/2022   Bone lesion 04/30/2021   H/O bilateral breast reduction surgery 12/26/2015   H/O abdominal supracervical subtotal hysterectomy 04/17/2014   Pap smear abnormality of cervix with ASCUS favoring dysplasia 04/17/2014    Current Outpatient Medications  Medication Sig Dispense Refill   acetaminophen (TYLENOL) 325 MG tablet Take 2 tablets (650 mg total) by mouth every 6 (six) hours as needed. 36 tablet 0   acetaminophen (TYLENOL) 500 MG tablet Take 500 mg by mouth every 6 (six) hours as needed for mild pain (pain score 1-3) or moderate pain (pain score 4-6).     benzonatate (TESSALON) 100 MG capsule Take 1 capsule (100 mg total) by mouth 2 (two) times daily as needed. 20 capsule 0   clobetasol ointment (TEMOVATE) 0.05 % Apply 1 Application topically daily.     cyclobenzaprine (FLEXERIL) 10 MG tablet Take 1 tablet (10 mg total) by mouth 2 (two) times daily as needed for muscle spasms. 20 tablet 0   ibuprofen  (ADVIL) 600 MG tablet Take 1 tablet (600 mg total) by mouth every 6 (six) hours as needed. 30 tablet 0   levothyroxine (SYNTHROID) 200 MCG tablet Take 1 tablet (200 mcg total) by mouth daily. 90 tablet 1   losartan-hydrochlorothiazide (HYZAAR) 100-12.5 MG tablet Take 1 tablet by mouth daily. 90 tablet 1   Multiple Vitamins-Minerals (CENTRUM ADULT PO) Take 1 tablet by mouth daily.     omeprazole (PRILOSEC) 20 MG capsule Take 1 capsule (20 mg total) by mouth daily. 90 capsule 1   oxyCODONE (ROXICODONE) 5 MG immediate release tablet Take 1 tablet (5 mg total) by mouth every 4 (four) hours as needed. 5 tablet 0   pimecrolimus (ELIDEL) 1 % cream Apply topically 2 (two) times daily. 30 g 0   potassium chloride (KLOR-CON M) 10 MEQ tablet TAKE 1 TABLET BY MOUTH EVERY DAY 90 tablet 3   predniSONE (DELTASONE) 10 MG tablet Take 2 tablets (20 mg total) by mouth daily with breakfast. 180 tablet 3   VITAMIN D PO Take 1 tablet by mouth daily.     Vitamin D, Ergocalciferol, (DRISDOL) 1.25 MG (50000 UNIT) CAPS capsule Take 50,000 Units by mouth once a week.     No current facility-administered medications for this visit.    Allergies: Patient has no known allergies.  Past Medical History:  Diagnosis Date   Arthritis    Body mass index (BMI) 36.0-36.9, adult 04/07/2023   Bone lesion 04/30/2021   Chronic pain    Dependence on other  enabling machines and devices 04/07/2023   Disorder of thyroid gland 04/07/2023   Excessive daytime sleepiness 02/05/2022   Failed back surgical syndrome 02/05/2022   Gastroesophageal reflux disease 04/07/2023   H/O abdominal supracervical subtotal hysterectomy 04/17/2014   H/O bilateral breast reduction surgery 12/26/2015   Formatting of this note might be different from the original. 2001     History of restless legs syndrome 02/05/2022   Hypertension    Insomnia secondary to chronic pain 02/05/2022   Interstitial lung disease (HCC)    Iron deficiency anemia 04/07/2023    Obstructive sleep apnea 04/07/2023   Pap smear abnormality of cervix with ASCUS favoring dysplasia 04/17/2014   Pneumonia    Poor compliance with CPAP treatment 06/30/2022   Poor sleep hygiene 06/30/2022   Post-surgical hypothyroidism 04/07/2023   Primary osteoarthritis, unspecified ankle and foot 04/07/2023   Restless legs syndrome 04/07/2023   Sciatica    Snoring 06/30/2022   Status post complete thyroidectomy 02/05/2022    Past Surgical History:  Procedure Laterality Date   ABDOMINAL HYSTERECTOMY     BACK SURGERY      No family history on file.  Social History   Tobacco Use   Smoking status: Never    Passive exposure: Never   Smokeless tobacco: Never  Substance Use Topics   Alcohol use: Never    Subjective:   Recent labs showed that Hgba1c was at 6.5- new onset diabetes; Patient is aware that she needs to cut back on the starches/ sugars; she also notes that she needs to be cutting out sodas and exercising more; is taking 20 mg prednisone daily per her pulmonologist- scheduled to follow up there next week to discuss further; notes that she feels bloated and more constipated since taking the prednisone; she has gained 13 pounds since appointment in November 2024;      Objective:  Vitals:   07/09/23 0843  BP: 132/84  Pulse: 74  SpO2: 98%  Weight: 270 lb (122.5 kg)  Height: 6\' 1"  (1.854 m)    General: Well developed, well nourished, in no acute distress  Skin : Warm and dry.  Head: Normocephalic and atraumatic  Eyes: Sclera and conjunctiva clear; pupils round and reactive to light; extraocular movements intact  Ears: External normal; canals clear; tympanic membranes normal  Oropharynx: Pink, supple. No suspicious lesions  Neck: Supple without thyromegaly, adenopathy  Lungs: Respirations unlabored; clear to auscultation bilaterally without wheeze, rales, rhonchi  CVS exam: normal rate and regular rhythm.  Abdomen: Soft; nontender; nondistended; normoactive bowel  sounds; no masses or hepatosplenomegaly  Musculoskeletal: No deformities; no active joint inflammation  Extremities: No edema, cyanosis, clubbing  Vessels: Symmetric bilaterally  Neurologic: Alert and oriented; speech intact; face symmetrical; moves all extremities well; CNII-XII intact without focal deficit   Assessment:  1. Diet-controlled diabetes mellitus (HCC)     Plan:  Refer to diabetes education; she will discuss weight gain/ concerns about prednisone with her pulmonologist next week- agree this could be contributing; follow up in 4 months, sooner prn.   Return in about 4 months (around 11/06/2023).  Orders Placed This Encounter  Procedures   Amb Referral to Nutrition and Diabetic Education    Referral Priority:   Routine    Referral Type:   Consultation    Referral Reason:   Specialty Services Required    Number of Visits Requested:   1    Requested Prescriptions    No prescriptions requested or ordered in this encounter

## 2023-07-14 ENCOUNTER — Ambulatory Visit: Payer: Medicare HMO | Admitting: Pulmonary Disease

## 2023-08-06 ENCOUNTER — Other Ambulatory Visit: Payer: Self-pay | Admitting: Family

## 2023-08-06 NOTE — Telephone Encounter (Signed)
 Copied from CRM [id number]. Topic: Clinical - Medication Refill >> Aug 06, 2023  2:09 PM Drema Balzarine wrote: Most Recent Primary Care Visit:  Provider: Ria Clock [facility]  Department: [facility]  Visit Type: OFFICE VISIT  Date: 07/09/2023  Medication: Vitamin D -patient would like to pick up by 4pm today along with other medications   Has the patient contacted their pharmacy? Yes (Agent: If no, request that the patient contact the pharmacy for the refill. If patient does not wish to contact the pharmacy document the reason why and proceed with request.) (Agent: If yes, when and what did the pharmacy advise?)  Is this the correct pharmacy for this prescription? Yes If no, delete pharmacy and type the correct one.  This is the patient's preferred pharmacy:   [id number] - [place], [place] - [address] [place] [address] Phone: [phone] Fax: [phone]   Has the prescription been filled recently? Yes  Is the patient out of the medication? Yes  Has the patient been seen for an appointment in the last year OR does the patient have an upcoming appointment? Yes  Can we respond through MyChart? No  Agent: Please be advised that Rx refills may take up to 3 business days. We ask that you follow-up with your pharmacy.

## 2023-08-19 ENCOUNTER — Telehealth: Payer: Self-pay

## 2023-08-19 NOTE — Telephone Encounter (Signed)
 I called and spoke to pt regarding her appointment tomorrow, ( 08-20-23 ) with Buelah Manis, NP for CPAP compliance. Pt states she has not been using it like she should be but she does want the pressure settings adjusted. I informed pt that the settings could be adjusted once Ms Clent Ridges see's her. Pt also could not tell me the DME company she uses, so I advised her to bring in her SD card. Pt verbalized understanding. NFN

## 2023-08-20 ENCOUNTER — Telehealth: Payer: Self-pay | Admitting: Primary Care

## 2023-08-20 ENCOUNTER — Encounter: Payer: Self-pay | Admitting: Primary Care

## 2023-08-20 ENCOUNTER — Ambulatory Visit (INDEPENDENT_AMBULATORY_CARE_PROVIDER_SITE_OTHER): Admitting: Primary Care

## 2023-08-20 VITALS — BP 126/78 | HR 86 | Temp 98.5°F | Wt 165.0 lb

## 2023-08-20 DIAGNOSIS — G4733 Obstructive sleep apnea (adult) (pediatric): Secondary | ICD-10-CM | POA: Diagnosis not present

## 2023-08-20 DIAGNOSIS — J438 Other emphysema: Secondary | ICD-10-CM

## 2023-08-20 DIAGNOSIS — J849 Interstitial pulmonary disease, unspecified: Secondary | ICD-10-CM | POA: Diagnosis not present

## 2023-08-20 NOTE — Telephone Encounter (Signed)
   Patient of yours whom you saw in early November. She has unspecified interstitial lung disease after hospitalization for pneumonia in 2013 Which was treated with a long course of steroids.  Previous imaging did not show any interstitial lung disease.  Review on follow-up CT   You ordered HRCT which was November 2024 that showed bilateral lower lobe peribronchovascular ground-glass and slight consolidation, similar to 01/26/2023 but new from 04/02/2021.  You ordered serology which looks negative. Treated her with prednisone.   She has no active cough. Mild DOE.  Do you want repeat HRTCT imaging 6 months (May 2025?)

## 2023-08-20 NOTE — Patient Instructions (Addendum)
-  OBSTRUCTIVE SLEEP APNEA: Obstructive sleep apnea is a condition where your airway becomes blocked during sleep, causing breathing pauses. You should resume using your CPAP machine with the auto settings (5 to 20 cm H2O) to help reduce fatigue and chest discomfort. We will assist you in obtaining new CPAP tubing from a medical supply store.  -ORGANIZING PNEUMONIA: Organizing pneumonia is a type of lung inflammation that can lead to scarring. Although your symptoms are mild, we need to monitor your condition. A repeat chest CT scan is scheduled for May to check for any changes.  -EMPHYSEMA: Emphysema is a lung condition that causes shortness of breath. Since you have no smoking history, it may be genetic. We will perform lab work for genetic testing related to emphysema to better understand your condition.  INSTRUCTIONS: Please resume using your CPAP machine with the auto settings (5 to 20 cm H2O) and obtain new CPAP tubing from a medical supply store. A repeat chest CT scan is scheduled for May to monitor your organizing pneumonia. We will also perform lab work for genetic testing related to your emphysema.  Orders: HRCT in May PFTs prior to next visit   Follow-up: 3-4 months with Dr. Isaiah Serge only / 1 hour PFT prior

## 2023-08-20 NOTE — Progress Notes (Signed)
 @Patient  ID: Caitlin White, female    DOB: 1966/05/08, 58 y.o.   MRN: [id number]  No chief complaint on file.   Referring provider: Adra Alanis,*  HPI: 58 y.o. with history of unspecified interstitial lung disease, sleep apnea She has moved here from [place]  and is here to establish care Previously followed by Dr. Gala Jubilee in [place], [place]    As per the patient she had severe bilateral pneumonia in 2013 requiring hospitalizations and fluid drainage.  She developed interstitial lung disease after that but does not recall the specific details.  She has been followed by pulmonary since then for monitoring and is not on any specific treatment History also notable for sleep apnea and has been on CPAP for the past 3 years   At present denies any dyspnea, fevers, chills.   Received clinic notes from patient's pulmonologist Dr. Elston Halsted dated 07/18/2020.  Review of anxiety Patient has history of interstitial lung disease, nonspecific ILD.   Open lung biopsy right lower lobe on 06/10/2012, she completed a long course of steroids with complete resolution of infiltrates  Core IR biopsy of right middle lobe nodule on 08/17/2015-patchy chronic interstitial inflammation and alveolar histiocytes, negative for granulomata, vasculitis or neoplasm.  She completed 2 months of prednisone from April to May 2017   She follows Dr. Luci Russell for evaluation of numeric hyperintense of lesions affecting vertebral bodies which is stable for 4 years.  History of total thyroidectomy for thyroid nodules in November 2020   Has history of OSA on CPAP   --------------------------------------------   Pets: No pets Occupation: Disabled since 2021.  Used to work as a Research scientist (medical) Exposures: No mold, hot tub, Jacuzzi.  No feather pillows or comforters Smoking history: Never smoker Travel history: Originally from [place] .  Moved to [place]  in 2022 Relevant family history: No  significant family history of lung disease   03/25/23 Discussed the use of AI scribe software for clinical note transcription with the patient, who gave verbal consent to proceed.   The patient, with a history of sleep apnea, presents for follow-up after a recent diagnosis of a lung infection. She initially presented to the ED in May with chest pain, right-sided discomfort, and difficulty getting out of the car due to pain under her arm. At that time, an x-ray showed clear lungs and an EKG was normal. However, in September, she saw her primary care physician with similar complaints, and a D-dimer was obtained due to concern for a pulmonary embolism. A subsequent CT angiogram was negative for clots but showed signs of a lower lobe pneumonia. She was treated with antibiotics and reports feeling better overall. All ED and clinic notes, imaging reviewed in detail.   She also reports being "lazy" with her CPAP machine for sleep apnea, citing the demands of caring for grandchildren and the effort of cleaning the machine as barriers to regular use.      Assessment:  Pneumonia Recent history of pneumonia treated with antibiotics. No current respiratory symptoms. CT angiogram in September showed mild pneumonia/bronchitis. -Order follow-up CT in the next few weeks to ensure resolution.   Evaluation of interstitial lung disease She has unspecified interstitial lung disease after hospitalization for pneumonia in 2013 Which was treated with a long course of steroids.  Previous imaging did not show any interstitial lung disease.  Review on follow-up CT   PFTs do show an isolated diffusion defect.  Echocardiogram ordered but not completed yet.  Since he is asymptomatic we will monitor.   Sclerotic bone lesions. This has been a longstanding issue per records from [place] .  Work-up here did not show any malignancy She has been evaluated by oncology for sclerotic lesions of the bone with PET scan and bone  biopsy which did not reveal any malignancy.   Sleep Apnea Non-compliance with CPAP therapy due to personal reasons. -Encouraged to resume CPAP therapy.   Follow-up in 6 months. CT results will be communicated in 2-3 weeks due to radiology backlog.     Plan/Recommendations: CT follow-up CPAP compliance   08/20/2023- interim hx  Discussed the use of AI scribe software for clinical note transcription with the patient, who gave verbal consent to proceed.  History of Present Illness   Caitlin White is a 58 year old female with severe sleep apnea who presents for an overdue follow-up.  She has not been using her CPAP machine for the last month or two, leading to increased fatigue. She sometimes falls asleep while watching TV and forgets to put on the machine. She experiences chest discomfort and prefers to sleep propped up with pillows, avoiding lying flat. 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 No recent episodes of waking up choking or gasping, no anxiety attacks, and no daily cough.  She has a history of interstitial lung disease, which developed after a hospitalization for pneumonia in 2013, treated with steroids. Previous imaging did not show interstitial lung disease, but a follow-up CT scan in November showed findings suggestive of organizing pneumonia. She denies having a cough and reports that her shortness of breath is occasional and does not significantly impact her ability to perform daily activities such as housework or grocery shopping.  She also has emphysema seen on CT imaging.  She has never smoked and reports no exposure to secondhand smoke or marijuana. She denies having a daily cough and reports that her shortness of breath is mild and occurs occasionally, such as when climbing stairs or when her legs hurt. She is able to perform her own laundry and other house chores without significant issues.     No Known  Allergies  Immunization History  Administered Date(s) Administered   Influenza, Seasonal, Injecte, Preservative Fre 03/20/2023   Influenza,inj,quad, With Preservative 01/25/2018, 02/03/2019   Influenza-Unspecified 03/12/2021   PNEUMOCOCCAL CONJUGATE-20 03/20/2023    Past Medical History:  Diagnosis Date   Arthritis    Body mass index (BMI) 36.0-36.9, adult 04/07/2023   Bone lesion 04/30/2021   Chronic pain    Dependence on other enabling machines and devices 04/07/2023   Disorder of thyroid gland 04/07/2023   Excessive daytime sleepiness 02/05/2022   Failed back surgical syndrome 02/05/2022   Gastroesophageal reflux disease 04/07/2023   H/O abdominal supracervical subtotal hysterectomy 04/17/2014   H/O bilateral breast reduction surgery 12/26/2015   Formatting of this note might be different from the original. 2001     History of restless legs syndrome 02/05/2022   Hypertension    Insomnia secondary to chronic pain 02/05/2022   Interstitial lung disease (HCC)    Iron deficiency anemia 04/07/2023   Obstructive sleep apnea 04/07/2023   Pap smear abnormality of cervix with ASCUS favoring dysplasia 04/17/2014   Pneumonia    Poor compliance with CPAP treatment 06/30/2022   Poor sleep hygiene 06/30/2022   Post-surgical hypothyroidism 04/07/2023   Primary osteoarthritis, unspecified ankle and foot 04/07/2023   Restless legs syndrome 04/07/2023  Sciatica    Snoring 06/30/2022   Status post complete thyroidectomy 02/05/2022    Tobacco History: Social History   Tobacco Use  Smoking Status Never   Passive exposure: Never  Smokeless Tobacco Never   Counseling given: Not Answered   Outpatient Medications Prior to Visit  Medication Sig Dispense Refill   acetaminophen (TYLENOL) 325 MG tablet Take 2 tablets (650 mg total) by mouth every 6 (six) hours as needed. 36 tablet 0   acetaminophen (TYLENOL) 500 MG tablet Take 500 mg by mouth every 6 (six) hours as needed for mild pain  (pain score 1-3) or moderate pain (pain score 4-6).     benzonatate (TESSALON) 100 MG capsule Take 1 capsule (100 mg total) by mouth 2 (two) times daily as needed. 20 capsule 0   clobetasol ointment (TEMOVATE) 0.05 % Apply 1 Application topically daily.     cyclobenzaprine (FLEXERIL) 10 MG tablet Take 1 tablet (10 mg total) by mouth 2 (two) times daily as needed for muscle spasms. 20 tablet 0   ibuprofen (ADVIL) 600 MG tablet Take 1 tablet (600 mg total) by mouth every 6 (six) hours as needed. 30 tablet 0   levothyroxine (SYNTHROID) 200 MCG tablet Take 1 tablet (200 mcg total) by mouth daily. 90 tablet 1   losartan-hydrochlorothiazide (HYZAAR) 100-12.5 MG tablet Take 1 tablet by mouth daily. 90 tablet 1   Multiple Vitamins-Minerals (CENTRUM ADULT PO) Take 1 tablet by mouth daily.     omeprazole (PRILOSEC) 20 MG capsule Take 1 capsule (20 mg total) by mouth daily. 90 capsule 1   oxyCODONE (ROXICODONE) 5 MG immediate release tablet Take 1 tablet (5 mg total) by mouth every 4 (four) hours as needed. 5 tablet 0   pimecrolimus (ELIDEL) 1 % cream Apply topically 2 (two) times daily. 30 g 0   potassium chloride (KLOR-CON M) 10 MEQ tablet TAKE 1 TABLET BY MOUTH EVERY DAY 90 tablet 3   predniSONE (DELTASONE) 10 MG tablet Take 2 tablets (20 mg total) by mouth daily with breakfast. 180 tablet 3   VITAMIN D PO Take 1 tablet by mouth daily.     Vitamin D, Ergocalciferol, (DRISDOL) 1.25 MG (50000 UNIT) CAPS capsule Take 50,000 Units by mouth once a week.     No facility-administered medications prior to visit.   Review of Systems  Review of Systems  Constitutional:  Positive for fatigue.  Respiratory:  Positive for shortness of breath. Negative for cough, chest tightness and wheezing.     Physical Exam  There were no vitals taken for this visit. Physical Exam Constitutional:      Appearance: Normal appearance. She is not ill-appearing.  HENT:     Head: Normocephalic and atraumatic.   Cardiovascular:     Rate and Rhythm: Normal rate and regular rhythm.  Pulmonary:     Effort: Pulmonary effort is normal.     Breath sounds: Normal breath sounds. No wheezing, rhonchi or rales.  Skin:    General: Skin is warm and dry.  Neurological:     General: No focal deficit present.     Mental Status: She is alert and oriented to person, place, and time. Mental status is at baseline.  Psychiatric:        Mood and Affect: Mood normal.        Behavior: Behavior normal.        Thought Content: Thought content normal.        Judgment: Judgment normal.      Lab  Results:  CBC    Component Value Date/Time   WBC 8.3 07/03/2023 1610   RBC 3.89 07/03/2023 1610   HGB 12.2 07/03/2023 1610   HGB 11.8 (L) 03/19/2023 1346   HCT 36.7 07/03/2023 1610   PLT 336 07/03/2023 1610   PLT 267 03/19/2023 1346   MCV 94.3 07/03/2023 1610   MCH 31.4 07/03/2023 1610   MCHC 33.2 07/03/2023 1610   RDW 12.8 07/03/2023 1610   LYMPHSABS 2.3 03/19/2023 1346   MONOABS 0.2 03/19/2023 1346   EOSABS 17 07/03/2023 1610   BASOSABS 33 07/03/2023 1610    BMET    Component Value Date/Time   NA 141 03/19/2023 1346   K 3.6 03/19/2023 1346   CL 105 03/19/2023 1346   CO2 29 03/19/2023 1346   GLUCOSE 132 (H) 03/19/2023 1346   BUN 16 03/19/2023 1346   CREATININE 0.81 03/19/2023 1346   CALCIUM 9.1 03/19/2023 1346   GFRNONAA >60 03/19/2023 1346    BNP No results found for: "BNP"  ProBNP No results found for: "PROBNP"  Imaging: No results found.   Assessment & Plan:   1. Interstitial lung disease (HCC) (Primary) - CT CHEST HIGH RESOLUTION; Future  2. OSA (obstructive sleep apnea) - AMB REFERRAL FOR DME   Assessment and Plan    Obstructive Sleep Apnea Severe obstructive sleep apnea confirmed with an apnea-hypopnea index of 34 events per hour. Non-compliance with CPAP therapy contributing to fatigue and chest discomfort. - Advise patient resume CPAP therapy with auto settings (5 to 20 cm  H2O). - Assist in obtaining new CPAP tubing/supplies from a medical supply store.  Organizing Pneumonia Organizing pneumonia with mild symptoms. Monitoring approach due to potential for scarring or fibrosis. - Order repeat chest CT scan in May to monitor for changes.  Emphysema Emphysema with no smoking history, possible genetic etiology. Occasional shortness of breath. - Perform lab work for genetic testing related to emphysema.      Antonio Baumgarten, NP 08/20/2023

## 2023-09-01 NOTE — Telephone Encounter (Signed)
 Sorry for the delay in replying as I was away She still on prednisone  at 20 mg/day.  I would repeat the high resolution CT now and if it is better then start a slow taper of prednisone  over the next 2 months I will reassess at follow-up visit with me in July of this year

## 2023-09-02 NOTE — Telephone Encounter (Signed)
 Spoke with [employer]. Advised of BW and Dr. Darcy Eaton notes. Pt verbalized understanding & had no concerns. NFN

## 2023-09-02 NOTE — Telephone Encounter (Signed)
 Please let patient know I spoke with Dr. Waylan Haggard, he recommended getting updated CT scan of her lungs which has already been ordered. Continue prednisone  20mg  daily. If better we will slowly start to taper off prednisone  over the next 2 months  Follow-up in July with Dr. Waylan Haggard

## 2023-09-16 ENCOUNTER — Telehealth: Payer: Self-pay | Admitting: Emergency Medicine

## 2023-09-16 ENCOUNTER — Ambulatory Visit: Payer: Self-pay

## 2023-09-16 NOTE — Telephone Encounter (Signed)
 Called and spoke with pt: verified 3 identifiers and explained reason for calling: call was disconnected.  Nurse called back x2 no answer from pt: left voicemail: will route to PCP office

## 2023-09-16 NOTE — Telephone Encounter (Signed)
 1st attempt to call patient, no answer, left mssage to call back.    Copied From CRM [id number]. Reason for Triage: Patient having low - no energy, requested vitamin D to be refilled back in March and it was never refilled. Please call [phone].

## 2023-09-16 NOTE — Telephone Encounter (Signed)
 Copied from CRM [id number]. Topic: Clinical - Prescription Issue >> Sep 16, 2023  4:20 PM Juleen Oakland F wrote: Reason for CRM: Patient requested refill for Vitamin D 08/06/23 and it was never refilled, she would like it sent to the [facility] on file. Please call her when it's sent, patient says she has low/no energy. [phone]

## 2023-09-17 ENCOUNTER — Encounter: Payer: Self-pay | Admitting: Family

## 2023-09-17 ENCOUNTER — Encounter: Payer: Self-pay | Admitting: Dietician

## 2023-09-17 ENCOUNTER — Encounter: Attending: Family | Admitting: Dietician

## 2023-09-17 ENCOUNTER — Other Ambulatory Visit: Payer: Self-pay | Admitting: Family

## 2023-09-17 DIAGNOSIS — E559 Vitamin D deficiency, unspecified: Secondary | ICD-10-CM

## 2023-09-17 DIAGNOSIS — E119 Type 2 diabetes mellitus without complications: Secondary | ICD-10-CM | POA: Diagnosis not present

## 2023-09-17 NOTE — Telephone Encounter (Signed)
 Spoke with pt, scheduled pt a follow up appointment with PCP 09/18/2023.

## 2023-09-17 NOTE — Patient Instructions (Signed)
 Mindfulness:  Consistently scheduled meal - avoid skipping  Choices  Eat slowly  Away from distraction (sitting in kitchen or dining room)  Stop eating when satisfied  Before a snack ask, "Am I hungry or eating for another reason?"   "What can I do instead if I am not hungry?"  Try to find something every day that brings you joy!   Consistent meals. Small portions.  Mostly unprocessed foods. Be sure to have protein with each meal (yogurt, cheese, cottage cheese, protein shake, boiled chicken, fish, beans, peanut butter). Start to walk.  Start slowly (maybe 15 minutes or as tolerated) and increase each week as tolerated. Continue to drink plenty of water.

## 2023-09-17 NOTE — Progress Notes (Signed)
 Diabetes Self-Management Education  Visit Type: First/Initial  Appt. Start Time: 1515 Appt. End Time: 1615  09/17/2023  Caitlin White, identified by name and date of birth, is a 58 y.o. female with a diagnosis of Diabetes: Type 2.   ASSESSMENT This visit was completed via telephone visit due to patient's inability to come to the office.. I spoke with patient and verified that I was speaking with the correct person with two patient identifiers (full name and date of birth).   I discussed the limitations related to this kind of visit and the patient is willing to proceed. Patient is in her home and I am in my office.  Patient states that her ankles are swollen today and she has to keep her feet elevated to improve this.  Does use compression socks. Is not taking her vitamins consistently. States that she eats the wrong food as that is what is around. She verbalized better meals that she can cook. Depressed that her dentures do not fit properly and hurt. States that she did not know that she has diabetes.  She is on prednisone . States that her husband has diabetes and he doesn't take care of himself.  Referral:  Type 2 Diabetes  History includes:  Type 2 Diabetes (06/2022), thyroid  removal (2020), teeth removal (2025)- dentures do not fit and cause her mouth to be sore and depressed due to this, gastric sleeve, HTN, GERD, neuropathy, history of vitamin D deficiency Medications include:  Prednisone , Omeprazole , MIV, Vitamin D (but not now) Labs noted to include:  A1C 6.5% 07/03/2023, GFR 99 on 01/23/19-24 Lipid Panel     Component Value Date/Time   CHOL 206 (H) 10/07/2022 1133   TRIG 162.0 (H) 10/07/2022 1133   HDL 54.10 10/07/2022 1133   CHOLHDL 4 10/07/2022 1133   VLDL 32.4 10/07/2022 1133   LDLCALC 119 (H) 10/07/2022 1133   Anthropometrics: 61" 265 lbs 08/20/2023  Patient lives with her husband (who has diabetes), son, daughter, 3 grandchildren live with her.  Her daughter  does the shopping and cooking. She states that her daughter does not cook the way that she needs to eat.  She is on disability due to her back.   Has a gym in her complex but does not go.  Does walk occasionally.      Diabetes Self-Management Education - 09/17/23 1543       Visit Information   Visit Type First/Initial      Initial Visit   Diabetes Type Type 2    Date Diagnosed 06/2023    Are you currently following a meal plan? No    Are you taking your medications as prescribed? Not on Medications      Health Coping   How would you rate your overall health? Good      Psychosocial Assessment   Patient Belief/Attitude about Diabetes Denial    What is the hardest part about your diabetes right now, causing you the most concern, or is the most worrisome to you about your diabetes?   Making healty food and beverage choices    Self-care barriers None    Self-management support Doctor's office    Other persons present Patient    Patient Concerns Nutrition/Meal planning    Special Needs None    Learning Readiness Ready    How often do you need to have someone help you when you read instructions, pamphlets, or other written materials from your doctor or pharmacy? 1 - Never    What  is the last grade level you completed in school? 12      Pre-Education Assessment   Patient understands the diabetes disease and treatment process. Needs Instruction    Patient understands incorporating nutritional management into lifestyle. Needs Instruction    Patient undertands incorporating physical activity into lifestyle. Needs Instruction    Patient understands using medications safely. Needs Instruction    Patient understands monitoring blood glucose, interpreting and using results Needs Instruction    Patient understands prevention, detection, and treatment of acute complications. Needs Instruction    Patient understands prevention, detection, and treatment of chronic complications. Needs Instruction     Patient understands how to develop strategies to address psychosocial issues. Needs Instruction    Patient understands how to develop strategies to promote health/change behavior. Needs Instruction      Complications   Last HgB A1C per patient/outside source 6.5 %   07/03/2023   Have you had a dilated eye exam in the past 12 months? No   eye bothers her and vision has changed   Have you had a dental exam in the past 12 months? Yes    Are you checking your feet? Yes    How many days per week are you checking your feet? 7      Dietary Intake   Snack (morning) banana    Lunch leftover shrimp alfredo    Snack (afternoon) none    Dinner shrimp alfredo    Snack (evening) chips    Beverage(s) water, sugar free drinks, occasional coffee with sweetened creamer      Activity / Exercise   Activity / Exercise Type ADL's      Patient Education   Previous Diabetes Education No    Disease Pathophysiology Definition of diabetes, type 1 and 2, and the diagnosis of diabetes    Healthy Eating Role of diet in the treatment of diabetes and the relationship between the three main macronutrients and blood glucose level;Plate Method;Meal options for control of blood glucose level and chronic complications.;Carbohydrate counting;Other (comment)   Mindful eating   Being Active Role of exercise on diabetes management, blood pressure control and cardiac health.;Helped patient identify appropriate exercises in relation to his/her diabetes, diabetes complications and other health issue.    Monitoring Identified appropriate SMBG and/or A1C goals.;Purpose and frequency of SMBG.;Daily foot exams;Yearly dilated eye exam    Acute complications Discussed and identified patients' prevention, symptoms, and treatment of hyperglycemia.;Taught prevention, symptoms, and  treatment of hypoglycemia - the 15 rule.    Chronic complications Relationship between chronic complications and blood glucose control;Identified and discussed  with patient  current chronic complications    Diabetes Stress and Support Identified and addressed patients feelings and concerns about diabetes;Worked with patient to identify barriers to care and solutions;Role of stress on diabetes      Individualized Goals (developed by patient)   Nutrition General guidelines for healthy choices and portions discussed    Physical Activity Exercise 5-7 days per week;15 minutes per day    Medications Not Applicable;Other (comment)   take vitamins as recommended   Monitoring  Test my blood glucose as discussed    Problem Solving Eating Pattern;Addressing barriers to behavior change    Reducing Risk do foot checks daily      Post-Education Assessment   Patient understands the diabetes disease and treatment process. Comprehends key points    Patient understands incorporating nutritional management into lifestyle. Needs Review    Patient undertands incorporating physical activity into lifestyle. Comprehends  key points    Patient understands using medications safely. Comphrehends key points    Patient understands monitoring blood glucose, interpreting and using results Comprehends key points    Patient understands prevention, detection, and treatment of acute complications. Comprehends key points    Patient understands prevention, detection, and treatment of chronic complications. Comprehends key points    Patient understands how to develop strategies to address psychosocial issues. Comprehends key points    Patient understands how to develop strategies to promote health/change behavior. Needs Review      Outcomes   Future DMSE 2 months    Program Status Not Completed             Individualized Plan for Diabetes Self-Management Training:   Learning Objective:  Patient will have a greater understanding of diabetes self-management. Patient education plan is to attend individual and/or group sessions per assessed needs and concerns.   Plan:    Patient Instructions  Mindfulness:  Consistently scheduled meal - avoid skipping  Choices  Eat slowly  Away from distraction (sitting in kitchen or dining room)  Stop eating when satisfied  Before a snack ask, "Am I hungry or eating for another reason?"   "What can I do instead if I am not hungry?"  Try to find something every day that brings you joy!   Consistent meals. Small portions.  Mostly unprocessed foods. Be sure to have protein with each meal (yogurt, cheese, cottage cheese, protein shake, boiled chicken, fish, beans, peanut butter). Start to walk.  Start slowly (maybe 15 minutes or as tolerated) and increase each week as tolerated. Continue to drink plenty of water.  Expected Outcomes:     Education material provided: ADA - How to Thrive: A Guide for Your Journey with Diabetes, My Plate, and Diabetes Resources - will mail to patient  If problems or questions, patient to contact team via:  Phone  Future DSME appointment: 2 months

## 2023-09-17 NOTE — Telephone Encounter (Signed)
 Called pt and scheduled a follow up with PCP 09/18/2023.

## 2023-09-18 ENCOUNTER — Encounter: Payer: Self-pay | Admitting: Family

## 2023-09-18 ENCOUNTER — Ambulatory Visit (HOSPITAL_COMMUNITY)

## 2023-09-18 ENCOUNTER — Other Ambulatory Visit: Payer: Self-pay | Admitting: Family

## 2023-09-18 ENCOUNTER — Ambulatory Visit (INDEPENDENT_AMBULATORY_CARE_PROVIDER_SITE_OTHER): Admitting: Family

## 2023-09-18 VITALS — BP 132/72 | HR 85 | Ht 73.0 in | Wt 269.0 lb

## 2023-09-18 DIAGNOSIS — E039 Hypothyroidism, unspecified: Secondary | ICD-10-CM | POA: Diagnosis not present

## 2023-09-18 DIAGNOSIS — E119 Type 2 diabetes mellitus without complications: Secondary | ICD-10-CM

## 2023-09-18 DIAGNOSIS — E559 Vitamin D deficiency, unspecified: Secondary | ICD-10-CM | POA: Diagnosis not present

## 2023-09-18 LAB — COMPREHENSIVE METABOLIC PANEL WITH GFR
ALT: 13 U/L (ref 0–35)
AST: 14 U/L (ref 0–37)
Albumin: 3.8 g/dL (ref 3.5–5.2)
Alkaline Phosphatase: 63 U/L (ref 39–117)
BUN: 9 mg/dL (ref 6–23)
CO2: 30 meq/L (ref 19–32)
Calcium: 8.9 mg/dL (ref 8.4–10.5)
Chloride: 103 meq/L (ref 96–112)
Creatinine, Ser: 0.75 mg/dL (ref 0.40–1.20)
GFR: 89.08 mL/min (ref >60.00)
Glucose, Bld: 125 mg/dL — ABNORMAL HIGH (ref 70–99)
Potassium: 3.6 meq/L (ref 3.5–5.1)
Sodium: 142 meq/L (ref 135–145)
Total Bilirubin: 0.5 mg/dL (ref 0.2–1.2)
Total Protein: 6.8 g/dL (ref 6.0–8.3)

## 2023-09-18 LAB — TSH: TSH: 0.55 u[IU]/mL (ref 0.35–5.50)

## 2023-09-18 LAB — VITAMIN D 25 HYDROXY (VIT D DEFICIENCY, FRACTURES): VITD: 29.17 ng/mL — ABNORMAL LOW (ref 30.00–100.00)

## 2023-09-18 LAB — HEMOGLOBIN A1C: Hgb A1c MFr Bld: 6.4 % (ref 4.6–6.5)

## 2023-09-18 MED ORDER — VITAMIN D (ERGOCALCIFEROL) 1.25 MG (50000 UNIT) PO CAPS: 50000.0000 [IU] | ORAL_CAPSULE | ORAL | 0 refills | Status: AC

## 2023-09-18 NOTE — Progress Notes (Signed)
 Caitlin White is a 58 y.o. female with the following history as recorded in Epic[employer]:  Patient Active Problem List   Diagnosis Date Noted   Body mass index (BMI) 36.0-36.9, adult 04/07/2023   Dependence on other enabling machines and devices 04/07/2023   Gastroesophageal reflux disease 04/07/2023   Iron deficiency anemia 04/07/2023   Obstructive sleep apnea 04/07/2023   Post-surgical hypothyroidism 04/07/2023   Primary osteoarthritis, unspecified ankle and foot 04/07/2023   Restless legs syndrome 04/07/2023   Disorder of thyroid  gland 04/07/2023   Arthritis    Chronic pain    Hypertension    Interstitial lung disease (HCC)    Pneumonia    Sciatica    Poor sleep hygiene 06/30/2022   Snoring 06/30/2022   Poor compliance with CPAP treatment 06/30/2022   Insomnia secondary to chronic pain 02/05/2022   Excessive daytime sleepiness 02/05/2022   History of restless legs syndrome 02/05/2022   Failed back surgical syndrome 02/05/2022   Status post complete thyroidectomy 02/05/2022   Bone lesion 04/30/2021   H/O bilateral breast reduction surgery 12/26/2015   H/O abdominal supracervical subtotal hysterectomy 04/17/2014   Pap smear abnormality of cervix with ASCUS favoring dysplasia 04/17/2014    Current Outpatient Medications  Medication Sig Dispense Refill   acetaminophen  (TYLENOL ) 500 MG tablet Take 500 mg by mouth every 6 (six) hours as needed for mild pain (pain score 1-3) or moderate pain (pain score 4-6).     clobetasol ointment (TEMOVATE) 0.05 % Apply 1 Application topically daily.     levothyroxine  (SYNTHROID ) 200 MCG tablet Take 1 tablet (200 mcg total) by mouth daily. 90 tablet 1   losartan -hydrochlorothiazide (HYZAAR) 100-12.5 MG tablet Take 1 tablet by mouth daily. 90 tablet 1   Multiple Vitamins-Minerals (CENTRUM ADULT PO) Take 1 tablet by mouth daily.     omeprazole  (PRILOSEC) 20 MG capsule Take 1 capsule (20 mg total) by mouth daily. 90 capsule 1   pimecrolimus  (ELIDEL )  1 % cream Apply topically 2 (two) times daily. 30 g 0   potassium chloride  (KLOR-CON  M) 10 MEQ tablet TAKE 1 TABLET BY MOUTH EVERY DAY 90 tablet 3   predniSONE  (DELTASONE ) 10 MG tablet Take 2 tablets (20 mg total) by mouth daily with breakfast. 180 tablet 3   ibuprofen  (ADVIL ) 600 MG tablet Take 1 tablet (600 mg total) by mouth every 6 (six) hours as needed. (Patient not taking: Reported on 08/20/2023) 30 tablet 0   No current facility-administered medications for this visit.    Allergies: Patient has no known allergies.  Past Medical History:  Diagnosis Date   Arthritis    Body mass index (BMI) 36.0-36.9, adult 04/07/2023   Bone lesion 04/30/2021   Chronic pain    Dependence on other enabling machines and devices 04/07/2023   Disorder of thyroid  gland 04/07/2023   Excessive daytime sleepiness 02/05/2022   Failed back surgical syndrome 02/05/2022   Gastroesophageal reflux disease 04/07/2023   H/O abdominal supracervical subtotal hysterectomy 04/17/2014   H/O bilateral breast reduction surgery 12/26/2015   Formatting of this note might be different from the original. 2001     History of restless legs syndrome 02/05/2022   Hypertension    Insomnia secondary to chronic pain 02/05/2022   Interstitial lung disease (HCC)    Iron deficiency anemia 04/07/2023   Obstructive sleep apnea 04/07/2023   Pap smear abnormality of cervix with ASCUS favoring dysplasia 04/17/2014   Pneumonia    Poor compliance with CPAP treatment 06/30/2022   Poor sleep hygiene  06/30/2022   Post-surgical hypothyroidism 04/07/2023   Primary osteoarthritis, unspecified ankle and foot 04/07/2023   Restless legs syndrome 04/07/2023   Sciatica    Snoring 06/30/2022   Status post complete thyroidectomy 02/05/2022    Past Surgical History:  Procedure Laterality Date   ABDOMINAL HYSTERECTOMY     BACK SURGERY      No family history on file.  Social History   Tobacco Use   Smoking status: Never    Passive  exposure: Never   Smokeless tobacco: Never  Substance Use Topics   Alcohol use: Never    Subjective:   Requesting to have labs updated today- concerned about her Vitamin D level; patient notes she has historically taken prescriptive Vitamin D once per week- this has never been written by our office; patient was concerned when recent refill request was denied; she is not taking any OTC Vitamin D supplement either.   Was told in February 2025 that she had new onset diabetes; has met with nutritionist this past week and feels that she is motivated to work on better food choices;   Objective:  Vitals:   09/18/23 0933  BP: 132/72  Pulse: 85  SpO2: 98%  Weight: 269 lb (122 kg)  Height: 6\' 1"  (1.854 m)    General: Well developed, well nourished, in no acute distress  Skin : Warm and dry.  Head: Normocephalic and atraumatic  Lungs: Respirations unlabored;  Neurologic: Alert and oriented; speech intact; face symmetrical; moves all extremities well; CNII-XII intact without focal deficit   Assessment:  1. Diet-controlled diabetes mellitus (HCC)   2. Vitamin D deficiency   3. Hypothyroidism, unspecified type     Plan:  Update Hgba1c today; reiterated again that she needs to be working on health food choices and follow up with nutrition as scheduled;  Discussed with patient that do not typically keep patient on prescriptive dosage of Vitamin D; standard of care is to use the prescription to get the Vitamin D level up and then the patient would take OTC Vitamin D3 for maintenance. Follow up to be determined after labs are obtained; Check TSH today;   No follow-ups on file.  Orders Placed This Encounter  Procedures   Comp Met (CMET)   Hemoglobin A1c   Vitamin D (25 hydroxy)   TSH    Requested Prescriptions    No prescriptions requested or ordered in this encounter

## 2023-09-23 ENCOUNTER — Ambulatory Visit: Payer: Self-pay

## 2023-09-23 NOTE — Progress Notes (Signed)
 Letter has been printed and placed up front to mail out to pt.

## 2023-10-09 ENCOUNTER — Other Ambulatory Visit: Payer: Self-pay | Admitting: Family

## 2023-10-09 MED ORDER — POTASSIUM CHLORIDE CRYS ER 10 MEQ PO TBCR: 10.0000 meq | EXTENDED_RELEASE_TABLET | Freq: Every day | ORAL | 1 refills | Status: DC

## 2023-10-09 NOTE — Telephone Encounter (Signed)
 Copied from CRM [id number]. Topic: Clinical - Medication Refill >> Oct 09, 2023 10:05 AM Adonis Hoot wrote: Medication: potassium chloride  (KLOR-CON  M) 10 MEQ tablet  Has the patient contacted their pharmacy? Yes (Agent: If no, request that the patient contact the pharmacy for the refill. If patient does not wish to contact the pharmacy document the reason why and proceed with request.) (Agent: If yes, when and what did the pharmacy advise?)  This is the patient's preferred pharmacy:  [id number] - [place], [place] - [address] [place] [address] Phone: [phone] Fax: [phone]  Is this the correct pharmacy for this prescription? Yes If no, delete pharmacy and type the correct one.   Has the prescription been filled recently? No  Is the patient out of the medication? Yes  Has the patient been seen for an appointment in the last year OR does the patient have an upcoming appointment? Yes  Can we respond through MyChart? Yes  Agent: Please be advised that Rx refills may take up to 3 business days. We ask that you follow-up with your pharmacy.

## 2023-10-19 NOTE — Telephone Encounter (Signed)
 Copied from CRM [id number]. Topic: Clinical - Medication Refill >> Oct 19, 2023 11:11 AM Caitlin White wrote: Medication: omeprazole  (PRILOSEC) 20 MG capsule  Has the patient contacted their pharmacy? Yes (Agent: If no, request that the patient contact the pharmacy for the refill. If patient does not wish to contact the pharmacy document the reason why and proceed with request.) (Agent: If yes, when and what did the pharmacy advise?)  This is the patient's preferred pharmacy:  [id number] - [place], [place] - [address] [place] [address] Phone: [phone] Fax: [phone]  Is this the correct pharmacy for this prescription? Yes If no, delete pharmacy and type the correct one.   Has the prescription been filled recently? No  Is the patient out of the medication? Yes  Has the patient been seen for an appointment in the last year OR does the patient have an upcoming appointment? Yes  Can we respond through MyChart? No  Agent: Please be advised that Rx refills may take up to 3 business days. We ask that you follow-up with your pharmacy.

## 2023-11-10 ENCOUNTER — Telehealth: Payer: Self-pay | Admitting: Neurology
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
## 2023-11-10 NOTE — Telephone Encounter (Signed)
 Pt called in about her sleep study results from feb 2024 that was completed. I was able to review the sleep study results. Since it has been over a year though, advised we would have to have a ov visit to repeat process since she was never set up. I informed her I would confirm with the DME company on whether a repeat SS would need to be completed since she never started treatment. Pt verbalized understanding. I was able to schedule with Lauraine NP for 9:15 am on 7/2. At this visit it will be to basically state same issues/concerns she has and if we order a SS, we can possibly try for inlab this time since Dr Chalice wanted that otherwise may have to repeat HST. Will update once I confirm insurance protocol with the DME.

## 2023-11-11 ENCOUNTER — Telehealth: Payer: Self-pay | Admitting: Primary Care

## 2023-11-11 ENCOUNTER — Encounter: Payer: Self-pay | Admitting: Neurology

## 2023-11-11 ENCOUNTER — Ambulatory Visit: Admitting: Neurology

## 2023-11-11 VITALS — BP 155/84 | HR 66 | Resp 15

## 2023-11-11 DIAGNOSIS — G4719 Other hypersomnia: Secondary | ICD-10-CM | POA: Diagnosis not present

## 2023-11-11 DIAGNOSIS — G4733 Obstructive sleep apnea (adult) (pediatric): Secondary | ICD-10-CM

## 2023-11-11 NOTE — Patient Instructions (Signed)
 Please follow-up with pulmonary about continued CPAP use.

## 2023-11-11 NOTE — Telephone Encounter (Signed)
 Yes, it was processed on 4/11 with [facility]. We received a call by the patient on 6/16 that she missed her appointment to receive her CPAP machine and we provided her the number for [facility]. The patient should have received their CPAP.

## 2023-11-11 NOTE — Telephone Encounter (Signed)
 I called and spoke to pt. Pt states she still has not received her CPAP machine. Pt states she did call them back but forgot her new appointment. I informed pt that she needs to give [facility] a call back and find out when her new appointment is with them. I gave pt their phone number. I also advised pt that once she receives her CPAP, she needs to call our office so we can schedule a CPAP compliance f/u. Pt verbalized understanding. NFN

## 2023-11-11 NOTE — Telephone Encounter (Signed)
 I placed an order for patient to establish with local DME and be provided replacement CPAP machine. Was this order processed>

## 2023-11-11 NOTE — Telephone Encounter (Signed)
 Thanks. Ashlyn can you call her and make sure she receive replacement CPAP machine and will need OV with me in 6 weeks

## 2023-11-12 ENCOUNTER — Telehealth: Payer: Self-pay | Admitting: Neurology

## 2023-11-12 NOTE — Telephone Encounter (Signed)
 [employer] pending

## 2023-11-14 ENCOUNTER — Other Ambulatory Visit: Payer: Self-pay | Admitting: Family

## 2023-11-14 DIAGNOSIS — G8929 Other chronic pain: Secondary | ICD-10-CM

## 2023-11-17 ENCOUNTER — Ambulatory Visit (INDEPENDENT_AMBULATORY_CARE_PROVIDER_SITE_OTHER)

## 2023-11-17 VITALS — Ht 73.0 in | Wt 269.0 lb

## 2023-11-17 DIAGNOSIS — Z0000 Encounter for general adult medical examination without abnormal findings: Secondary | ICD-10-CM | POA: Diagnosis not present

## 2023-11-17 DIAGNOSIS — Z1231 Encounter for screening mammogram for malignant neoplasm of breast: Secondary | ICD-10-CM

## 2023-11-17 NOTE — Progress Notes (Signed)
 Subjective:   Caitlin White is a 58 y.o. who presents for a Medicare Wellness preventive visit.  As a reminder, Annual Wellness Visits don't include a physical exam, and some assessments may be limited, especially if this visit is performed virtually. We may recommend an in-person follow-up visit with your provider if needed.  Visit Complete: Virtual I connected with  Caitlin White on 11/17/23 by a audio enabled telemedicine application and verified that I am speaking with the correct person using two identifiers.  Patient Location: Home  Provider Location: Home Office  I discussed the limitations of evaluation and management by telemedicine. The patient expressed understanding and agreed to proceed.  Vital Signs: Because this visit was a virtual/telehealth visit, some criteria may be missing or patient reported. Any vitals not documented were not able to be obtained and vitals that have been documented are patient reported.  VideoError- Librarian, academic were attempted between this provider and patient, however failed, due to patient having technical difficulties OR patient did not have access to video capability.  We continued and completed visit with audio only.   Persons Participating in Visit: Patient.  AWV Questionnaire: No: Patient Medicare AWV questionnaire was not completed prior to this visit.  Cardiac Risk Factors include: hypertension;sedentary lifestyle     Objective:    Today's Vitals   11/17/23 0835  Weight: 269 lb (122 kg)  Height: 6' 1 (1.854 m)   Body mass index is 35.49 kg/m.     11/17/2023    8:42 AM 09/17/2023    3:25 PM 05/24/2023   10:29 AM 03/19/2023    2:04 PM 11/20/2022    3:00 PM 09/26/2022    8:37 AM 10/15/2021    7:27 PM  Advanced Directives  Does Patient Have a Medical Advance Directive? No No No No No No No  Would patient like information on creating a medical advance directive? Yes (MAU/Ambulatory/Procedural Areas -  Information given) No - Patient declined No - Patient declined No - Patient declined No - Patient declined No - Patient declined     Current Medications (verified) Outpatient Encounter Medications as of 11/17/2023  Medication Sig   acetaminophen  (TYLENOL ) 500 MG tablet Take 500 mg by mouth every 6 (six) hours as needed for mild pain (pain score 1-3) or moderate pain (pain score 4-6).   ibuprofen  (ADVIL ) 600 MG tablet Take 1 tablet (600 mg total) by mouth every 6 (six) hours as needed.   levothyroxine  (SYNTHROID ) 200 MCG tablet Take 1 tablet (200 mcg total) by mouth daily.   losartan -hydrochlorothiazide (HYZAAR) 100-12.5 MG tablet Take 1 tablet by mouth daily.   Multiple Vitamins-Minerals (CENTRUM ADULT PO) Take 1 tablet by mouth daily.   omeprazole  (PRILOSEC) 20 MG capsule Take 1 capsule (20 mg total) by mouth daily.   pimecrolimus  (ELIDEL ) 1 % cream Apply topically 2 (two) times daily.   potassium chloride  (KLOR-CON  M) 10 MEQ tablet Take 1 tablet (10 mEq total) by mouth daily.   predniSONE  (DELTASONE ) 10 MG tablet Take 2 tablets (20 mg total) by mouth daily with breakfast.   Vitamin D , Ergocalciferol , (DRISDOL ) 1.25 MG (50000 UNIT) CAPS capsule Take 1 capsule (50,000 Units total) by mouth every 7 (seven) days for 12 doses.   No facility-administered encounter medications on file as of 11/17/2023.    Allergies (verified) Patient has no known allergies.   History: Past Medical History:  Diagnosis Date   Arthritis    Body mass index (BMI) 36.0-36.9, adult 04/07/2023  Bone lesion 04/30/2021   Chronic pain    Dependence on other enabling machines and devices 04/07/2023   Disorder of thyroid  gland 04/07/2023   Excessive daytime sleepiness 02/05/2022   Failed back surgical syndrome 02/05/2022   Gastroesophageal reflux disease 04/07/2023   H/O abdominal supracervical subtotal hysterectomy 04/17/2014   H/O bilateral breast reduction surgery 12/26/2015   Formatting of this note might be  different from the original. 2001     Heart murmur    History of restless legs syndrome 02/05/2022   Hypertension    Insomnia secondary to chronic pain 02/05/2022   Interstitial lung disease (HCC)    Iron deficiency anemia 04/07/2023   Obstructive sleep apnea 04/07/2023   Pap smear abnormality of cervix with ASCUS favoring dysplasia 04/17/2014   Pneumonia    Poor compliance with CPAP treatment 06/30/2022   Poor sleep hygiene 06/30/2022   Post-surgical hypothyroidism 04/07/2023   Primary osteoarthritis, unspecified ankle and foot 04/07/2023   Restless legs syndrome 04/07/2023   Sciatica    Snoring 06/30/2022   Status post complete thyroidectomy 02/05/2022   Past Surgical History:  Procedure Laterality Date   ABDOMINAL HYSTERECTOMY     BACK SURGERY     COLONOSCOPY  2022   [place]   No family history on file. Social History   Socioeconomic History   Marital status: Married    Spouse name: Not on file   Number of children: Not on file   Years of education: Not on file   Highest education level: Not on file  Occupational History   Not on file  Tobacco Use   Smoking status: Never    Passive exposure: Never   Smokeless tobacco: Never  Vaping Use   Vaping status: Never Used  Substance and Sexual Activity   Alcohol use: Never   Drug use: Never   Sexual activity: Yes    Birth control/protection: Surgical  Other Topics Concern   Not on file  Social History Narrative   Not on file   Social Drivers of Health   Financial Resource Strain: Low Risk  (11/17/2023)   Overall Financial Resource Strain (CARDIA)    Difficulty of Paying Living Expenses: Not hard at all  Food Insecurity: No Food Insecurity (11/17/2023)   Hunger Vital Sign    Worried About Running Out of Food in the Last Year: Never true    Ran Out of Food in the Last Year: Never true  Transportation Needs: No Transportation Needs (11/17/2023)   PRAPARE - Administrator, Civil Service (Medical): No    Lack of  Transportation (Non-Medical): No  Physical Activity: Inactive (11/17/2023)   Exercise Vital Sign    Days of Exercise per Week: 0 days    Minutes of Exercise per Session: 0 min  Stress: No Stress Concern Present (11/17/2023)   [employer] of Occupational Health - Occupational Stress Questionnaire    Feeling of Stress: Not at all  Social Connections: Moderately Isolated (11/17/2023)   Social Connection and Isolation Panel    Frequency of Communication with Friends and Family: More than three times a week    Frequency of Social Gatherings with Friends and Family: Once a week    Attends Religious Services: Never    Database administrator or Organizations: No    Attends Banker Meetings: Never    Marital Status: Married    Tobacco Counseling Counseling given: Not Answered    Clinical Intake:  Pre-visit preparation completed: Yes  Pain :  No/denies pain     Diabetes: No  Lab Results  Component Value Date   HGBA1C 6.4 09/18/2023   HGBA1C 6.5 (H) 07/03/2023   HGBA1C 6.0 03/20/2023     How often do you need to have someone help you when you read instructions, pamphlets, or other written materials from your doctor or pharmacy?: 1 - Never  Interpreter Needed?: No  Information entered by :: Charmaine Bloodgood LPN   Activities of Daily Living     11/17/2023    8:42 AM 11/20/2022    2:55 PM  In your present state of health, do you have any difficulty performing the following activities:  Hearing? 0 0  Vision? 0 0  Difficulty concentrating or making decisions? 0 0  Walking or climbing stairs? 0 0  Dressing or bathing? 0 0  Doing errands, shopping? 0 0  Preparing Food and eating ? N N  Using the Toilet? N N  In the past six months, have you accidently leaked urine? N N  Do you have problems with loss of bowel control? N N  Managing your Medications? N N  Managing your Finances? N N  Housekeeping or managing your Housekeeping? N N    Patient Care Team: Jason Leita Repine, FNP as PCP - General (Internal Medicine) Latisha Medford, MD as Consulting Physician (Obstetrics and Gynecology) Gayland Lauraine PARAS, NP as Nurse Practitioner (Neurology)  I have updated your Care Teams any recent Medical Services you may have received from other providers in the past year.     Assessment:   This is a routine wellness examination for [place].  Hearing/Vision screen Hearing Screening - Comments:: Denies hearing difficulties   Vision Screening - Comments:: No vision problems; will schedule routine eye exam soon     Goals Addressed             This Visit's Progress    Maintain health and independence   On track      Depression Screen     11/17/2023    8:40 AM 09/17/2023    3:24 PM 11/20/2022    3:04 PM 10/07/2022   10:51 AM  PHQ 2/9 Scores  PHQ - 2 Score 0 0 0 0    Fall Risk     11/17/2023    8:41 AM 09/17/2023    3:23 PM 08/20/2023    8:48 AM 11/20/2022    2:58 PM 10/07/2022   10:51 AM  Fall Risk   Falls in the past year? 0 0 1 1 1   Number falls in past yr: 0  0 0 0  Injury with Fall? 0   0 0  Risk for fall due to : No Fall Risks   No Fall Risks History of fall(s)  Risk for fall due to: Comment    had a dizzy spell   Follow up Falls prevention discussed;Education provided;Falls evaluation completed   Falls evaluation completed Falls evaluation completed    MEDICARE RISK AT HOME:  Medicare Risk at Home Any stairs in or around the home?: No If so, are there any without handrails?: No Home free of loose throw rugs in walkways, pet beds, electrical cords, etc?: Yes Adequate lighting in your home to reduce risk of falls?: Yes Life alert?: No Use of a cane, walker or w/c?: No Grab bars in the bathroom?: Yes Shower chair or bench in shower?: No Elevated toilet seat or a handicapped toilet?: Yes  TIMED UP AND GO:  Was the test performed?  No  Cognitive Function: Declined/Normal: No cognitive concerns noted by patient or family. Patient alert,  oriented, able to answer questions appropriately and recall recent events. No signs of memory loss or confusion.        11/20/2022    3:06 PM  6CIT Screen  What Year? 0 points  What month? 0 points  What time? 0 points  Count back from 20 0 points  Months in reverse 0 points  Repeat phrase 6 points  Total Score 6 points    Immunizations Immunization History  Administered Date(s) Administered   Influenza, Seasonal, Injecte, Preservative Fre 03/20/2023   Influenza,inj,quad, With Preservative 01/25/2018, 02/03/2019   Influenza-Unspecified 03/12/2021   PNEUMOCOCCAL CONJUGATE-20 03/20/2023   Pneumococcal Polysaccharide-23 08/13/2019   Zoster Recombinant(Shingrix) 07/25/2020, 12/24/2020    Screening Tests Health Maintenance  Topic Date Due   COVID-19 Vaccine (1) Never done   HIV Screening  Never done   Diabetic kidney evaluation - Urine ACR  Never done   DTaP/Tdap/Td (1 - Tdap) Never done   Hepatitis B Vaccines (1 of 3 - 19+ 3-dose series) Never done   Colonoscopy  Never done   INFLUENZA VACCINE  12/11/2023   Diabetic kidney evaluation - eGFR measurement  09/17/2024   MAMMOGRAM  10/20/2024   Medicare Annual Wellness (AWV)  11/16/2024   Pneumococcal Vaccine 110-1 Years old  Completed   Hepatitis C Screening  Completed   Zoster Vaccines- Shingrix  Completed   HPV VACCINES  Aged Out   Meningococcal B Vaccine  Aged Out    Health Maintenance  Health Maintenance Due  Topic Date Due   COVID-19 Vaccine (1) Never done   HIV Screening  Never done   Diabetic kidney evaluation - Urine ACR  Never done   DTaP/Tdap/Td (1 - Tdap) Never done   Hepatitis B Vaccines (1 of 3 - 19+ 3-dose series) Never done   Colonoscopy  Never done   Health Maintenance Items Addressed: Mammogram ordered  Additional Screening:  Vision Screening: Recommended annual ophthalmology exams for early detection of glaucoma and other disorders of the eye. Would you like a referral to an eye doctor? No     Dental Screening: Recommended annual dental exams for proper oral hygiene  Community Resource Referral / Chronic Care Management: CRR required this visit?  No   CCM required this visit?  No   Plan:    I have personally reviewed and noted the following in the patient's chart:   Medical and social history Use of alcohol, tobacco or illicit drugs  Current medications and supplements including opioid prescriptions. Patient is not currently taking opioid prescriptions. Functional ability and status Nutritional status Physical activity Advanced directives List of other physicians Hospitalizations, surgeries, and ER visits in previous 12 months Vitals Screenings to include cognitive, depression, and falls Referrals and appointments  In addition, I have reviewed and discussed with patient certain preventive protocols, quality metrics, and best practice recommendations. A written personalized care plan for preventive services as well as general preventive health recommendations were provided to patient.   Lavelle Pfeiffer [place], [place]   06/12/7972   After Visit Summary: (MyChart) Due to this being a telephonic visit, the after visit summary with patients personalized plan was offered to patient via MyChart   Notes: Nothing significant to report at this time.

## 2023-11-17 NOTE — Patient Instructions (Signed)
 Ms. Abend , Thank you for taking time out of your busy schedule to complete your Annual Wellness Visit with me. I enjoyed our conversation and look forward to speaking with you again next year. I, as well as your care team,  appreciate your ongoing commitment to your health goals. Please review the following plan we discussed and let me know if I can assist you in the future. Your Game plan/ To Do List    Referrals: You have an order for:  []   2D Mammogram  [x]   3D Mammogram  []   Bone Density     Please call for appointment:   [facility] [address] [place], [place] [address] [phone]    Make sure to wear two-piece clothing.  No lotions, powders, or deodorants the day of the appointment. Make sure to bring picture ID and insurance card.  Bring list of medications you are currently taking including any supplements.   Follow up Visits: Next Medicare AWV with our clinical staff: In 1 year    Have you seen your provider in the last 6 months (3 months if uncontrolled diabetes)? Yes Next Office Visit with your provider: To be scheduled  Clinician Recommendations:  Aim for 30 minutes of exercise or brisk walking, 6-8 glasses of water, and 5 servings of fruits and vegetables each day.       This is a list of the screening recommended for you and due dates:  Health Maintenance  Topic Date Due   COVID-19 Vaccine (1) Never done   HIV Screening  Never done   Yearly kidney health urinalysis for diabetes  Never done   DTaP/Tdap/Td vaccine (1 - Tdap) Never done   Hepatitis B Vaccine (1 of 3 - 19+ 3-dose series) Never done   Colon Cancer Screening  Never done   Flu Shot  12/11/2023   Yearly kidney function blood test for diabetes  09/17/2024   Mammogram  10/20/2024   Medicare Annual Wellness Visit  11/16/2024   Pneumococcal Vaccination  Completed   Hepatitis C Screening  Completed   Zoster (Shingles) Vaccine  Completed   HPV Vaccine   Aged Out   Meningitis B Vaccine  Aged Out    Advanced directives: (ACP Link)Information on Advanced Care Planning can be found at [place]  Print production planner Health Care Directives Advance Health Care Directives. [email or web address]   Advance Care Planning is important because it:  [x]  Makes sure you receive the medical care that is consistent with your values, goals, and preferences  [x]  It provides guidance to your family and loved ones and reduces their decisional burden about whether or not they are making the right decisions based on your wishes.  Follow the link provided in your after visit summary or read over the paperwork we have mailed to you to help you started getting your Advance Directives in place. If you need assistance in completing these, please reach out to us  so that we can help you!  See attachments for Preventive Care and Fall Prevention Tips.

## 2023-11-19 ENCOUNTER — Encounter: Attending: Family | Admitting: Dietician

## 2023-11-19 ENCOUNTER — Encounter: Payer: Self-pay | Admitting: Dietician

## 2023-11-19 DIAGNOSIS — E119 Type 2 diabetes mellitus without complications: Secondary | ICD-10-CM | POA: Insufficient documentation

## 2023-11-19 NOTE — Progress Notes (Signed)
 Diabetes Self-Management Education  Visit Type: Follow-up  Appt. Start Time: 0912 Appt. End Time: 0933  11/19/2023  [name], identified by name and date of birth, is a 57 y.o. female with a diagnosis of Diabetes:  .   ASSESSMENT This visit was completed via telephone visit due to patient's inability to come to the office. Patient was last seen by this RD on 09/17/2023.  I spoke with patient and verified that I was speaking with the correct person with two patient identifiers (full name and date of birth).   I discussed the limitations related to this kind of visit and the patient is willing to proceed. Patient is driving and I am in my office.  Phone connection was poor and visit had to end early as patient picked up an [employer] client.  Patient states that her ankles are swollen today and she has to keep her feet elevated to improve this.  Does use compression socks. States that she is fasting today due to feeling unwell. She verbalized better meals that she can cook. Depressed that her dentures do not fit properly and hurt.  Referral:  Type 2 Diabetes   History includes:  Type 2 Diabetes (06/2022), thyroid  removal (2020), teeth removal (2025)- dentures do not fit and cause her mouth to be sore and depressed due to this, gastric sleeve, HTN, GERD, neuropathy, history of vitamin D  deficiency Medications include:  Prednisone , Omeprazole , MIV, Vitamin D   Labs noted to include:  A1C 6.4% on 09/18/2023, 6.5% 07/03/2023, GFR 99 on 01/23/19-24 Lipid Panel          Component Value Date/Time    CHOL 206 (H) 10/07/2022 1133    TRIG 162.0 (H) 10/07/2022 1133    HDL 54.10 10/07/2022 1133    CHOLHDL 4 10/07/2022 1133    VLDL 32.4 10/07/2022 1133    LDLCALC 119 (H) 10/07/2022 1133    Anthropometrics: 61 269 lbs 11/17/2023 265 lbs 08/20/2023   Patient lives with her husband (who has diabetes), son, daughter, 3 grandchildren live with her. She does the shopping and cooking or they eat  out. She states that her daughter does not cook the way that she needs to eat.  She is on disability due to her back.  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   Feet swell. Has a gym in her complex but does not go.  Does walk occasionally.   States that her husband has diabetes and he doesn't take care of himself.    Diabetes Self-Management Education - 11/19/23 0952       Visit Information   Visit Type Follow-up      Psychosocial Assessment   Patient Belief/Attitude about Diabetes Denial    What is the hardest part about your diabetes right now, causing you the most concern, or is the most worrisome to you about your diabetes?   Making healty food and beverage choices    Self-care barriers None    Self-management support Doctor's office    Other persons present Patient    Patient Concerns Nutrition/Meal planning    Special Needs None    Preferred Learning Style No preference indicated    Learning Readiness Contemplating      Pre-Education Assessment   Patient understands the diabetes disease and treatment process. Comprehends key points    Patient understands incorporating nutritional management into lifestyle. Needs Review    Patient undertands incorporating physical activity into lifestyle. Needs Review    Patient understands using medications safely. N/A (  comment)    Patient understands monitoring blood glucose, interpreting and using results Comprehends key points    Patient understands prevention, detection, and treatment of acute complications. Comprehends key points    Patient understands prevention, detection, and treatment of chronic complications. Compreheands key points    Patient understands how to develop strategies to address psychosocial issues. Needs Review    Patient understands how to develop strategies to promote health/change behavior. Needs Review      Complications   Last HgB A1C per patient/outside source 6.5 %   09/18/2023   How often do you check your blood  sugar? 0 times/day (not testing)      Dietary Intake   Breakfast yogurt, belvita    Snack (morning) none    Lunch 2 egg rolls    Snack (afternoon) none    Dinner shrimp and fried rice    Snack (evening) reeces and chips    Beverage(s) water, watermelon juice      Patient Education   Healthy Eating Meal options for control of blood glucose level and chronic complications.;Other (comment);Information on hints to eating out and maintain blood glucose control.   low sodium   Diabetes Stress and Support Identified and addressed patients feelings and concerns about diabetes;Worked with patient to identify barriers to care and solutions    Lifestyle and Health Coping Lifestyle issues that need to be addressed for better diabetes care      Individualized Goals (developed by patient)   Nutrition General guidelines for healthy choices and portions discussed    Physical Activity 30 minutes per day;Exercise 5-7 days per week    Medications take my medication as prescribed    Problem Solving Eating Pattern;Addressing barriers to behavior change      Patient Self-Evaluation of Goals - Patient rates self as meeting previously set goals (% of time)   Nutrition 50 - 75 % (half of the time)    Physical Activity < 25% (hardly ever/never)    Medications Not Applicable    Monitoring Not Applicable    Problem Solving and behavior change strategies  25 - 50% (sometimes)    Reducing Risk (treating acute and chronic complications) 25 - 50% (sometimes)    Health Coping 25 - 50% (sometimes)      Post-Education Assessment   Patient understands the diabetes disease and treatment process. Comprehends key points    Patient understands incorporating nutritional management into lifestyle. Needs Review    Patient undertands incorporating physical activity into lifestyle. Comprehends key points    Patient understands using medications safely. Comphrehends key points    Patient understands monitoring blood glucose,  interpreting and using results Comprehends key points    Patient understands prevention, detection, and treatment of acute complications. Comprehends key points    Patient understands prevention, detection, and treatment of chronic complications. Comprehends key points    Patient understands how to develop strategies to address psychosocial issues. Comprehends key points    Patient understands how to develop strategies to promote health/change behavior. Needs Review      Outcomes   Expected Outcomes Demonstrated limited interest in learning.  Expect minimal changes    Future DMSE PRN    Program Status Completed      Subsequent Visit   Since your last visit have you experienced any weight changes? Gain    Weight Gain (lbs) 4          Individualized Plan for Diabetes Self-Management Training:   Learning Objective:  Patient will  have a greater understanding of diabetes self-management. Patient education plan is to attend individual and/or group sessions per assessed needs and concerns.   Plan:   There are no Patient Instructions on file for this visit.  Expected Outcomes:  Demonstrated limited interest in learning.  Expect minimal changes  Education material provided:   If problems or questions, patient to contact team via:  Phone  Future DSME appointment: PRN

## 2023-11-24 NOTE — Telephone Encounter (Signed)
 NPSG [employer] medicare shara: [id number] (exp. 11/12/23 to 05/14/24)

## 2023-11-24 NOTE — Telephone Encounter (Signed)
 Spoke with the patient.  NPSG [employer] medicare shara: [id number] (exp. 11/12/23 05/14/24)   She is scheduled at [facility] 12/20/2023 at 8 pm. Mailed packet and sent mychart.  She also stated she doesn't remember who here DME company is for the the CPAP supplies and would like a call back with that information and the phone number.

## 2023-11-26 DIAGNOSIS — R600 Localized edema: Secondary | ICD-10-CM | POA: Diagnosis not present

## 2023-11-26 DIAGNOSIS — J849 Interstitial pulmonary disease, unspecified: Secondary | ICD-10-CM | POA: Diagnosis not present

## 2023-11-26 DIAGNOSIS — M4800 Spinal stenosis, site unspecified: Secondary | ICD-10-CM | POA: Diagnosis not present

## 2023-11-26 DIAGNOSIS — Z008 Encounter for other general examination: Secondary | ICD-10-CM | POA: Diagnosis not present

## 2023-11-26 DIAGNOSIS — J449 Chronic obstructive pulmonary disease, unspecified: Secondary | ICD-10-CM | POA: Diagnosis not present

## 2023-11-26 DIAGNOSIS — E876 Hypokalemia: Secondary | ICD-10-CM | POA: Diagnosis not present

## 2023-11-26 DIAGNOSIS — K219 Gastro-esophageal reflux disease without esophagitis: Secondary | ICD-10-CM | POA: Diagnosis not present

## 2023-11-26 DIAGNOSIS — Z79890 Hormone replacement therapy (postmenopausal): Secondary | ICD-10-CM | POA: Diagnosis not present

## 2023-11-26 DIAGNOSIS — I10 Essential (primary) hypertension: Secondary | ICD-10-CM | POA: Diagnosis not present

## 2023-11-26 DIAGNOSIS — G629 Polyneuropathy, unspecified: Secondary | ICD-10-CM | POA: Diagnosis not present

## 2023-11-26 DIAGNOSIS — Z8249 Family history of ischemic heart disease and other diseases of the circulatory system: Secondary | ICD-10-CM | POA: Diagnosis not present

## 2023-11-26 DIAGNOSIS — M1990 Unspecified osteoarthritis, unspecified site: Secondary | ICD-10-CM | POA: Diagnosis not present

## 2023-12-08 ENCOUNTER — Other Ambulatory Visit: Payer: Self-pay | Admitting: *Deleted

## 2023-12-08 ENCOUNTER — Ambulatory Visit (HOSPITAL_BASED_OUTPATIENT_CLINIC_OR_DEPARTMENT_OTHER)
Admission: RE | Admit: 2023-12-08 | Discharge: 2023-12-08 | Disposition: A | Discharge status: Home / Self Care | Source: Ambulatory Visit | Attending: Family | Admitting: Family

## 2023-12-08 ENCOUNTER — Encounter (HOSPITAL_BASED_OUTPATIENT_CLINIC_OR_DEPARTMENT_OTHER): Payer: Self-pay

## 2023-12-08 DIAGNOSIS — Z1231 Encounter for screening mammogram for malignant neoplasm of breast: Secondary | ICD-10-CM | POA: Insufficient documentation

## 2023-12-08 DIAGNOSIS — J849 Interstitial pulmonary disease, unspecified: Secondary | ICD-10-CM

## 2023-12-10 ENCOUNTER — Ambulatory Visit: Payer: Self-pay | Admitting: Primary Care

## 2023-12-10 ENCOUNTER — Ambulatory Visit: Admitting: Pulmonary Disease

## 2023-12-10 ENCOUNTER — Encounter: Payer: Self-pay | Admitting: Pulmonary Disease

## 2023-12-10 ENCOUNTER — Other Ambulatory Visit: Payer: Self-pay | Admitting: Pulmonary Disease

## 2023-12-10 VITALS — BP 142/82 | HR 72 | Temp 98.8°F | Ht 72.5 in | Wt 264.0 lb

## 2023-12-10 DIAGNOSIS — J849 Interstitial pulmonary disease, unspecified: Secondary | ICD-10-CM

## 2023-12-10 DIAGNOSIS — G4733 Obstructive sleep apnea (adult) (pediatric): Secondary | ICD-10-CM

## 2023-12-10 LAB — PULMONARY FUNCTION TEST
DL/VA % pred: 117 %
DL/VA: 4.69 ml/min/mmHg/L
DLCO cor % pred: 72 %
DLCO cor: 19.58 ml/min/mmHg
DLCO unc % pred: 72 %
DLCO unc: 19.58 ml/min/mmHg
FEF 25-75 Post: 5.16 L/s
FEF 25-75 Pre: 2.86 L/s
FEF2575-%Change-Post: 80 %
FEF2575-%Pred-Post: 169 %
FEF2575-%Pred-Pre: 94 %
FEV1-%Change-Post: 11 %
FEV1-%Pred-Post: 79 %
FEV1-%Pred-Pre: 70 %
FEV1-Post: 2.78 L
FEV1-Pre: 2.5 L
FEV1FVC-%Change-Post: 6 %
FEV1FVC-%Pred-Pre: 106 %
FEV6-%Change-Post: 4 %
FEV6-%Pred-Post: 70 %
FEV6-%Pred-Pre: 67 %
FEV6-Post: 3.09 L
FEV6-Pre: 2.97 L
FEV6FVC-%Pred-Post: 103 %
FEV6FVC-%Pred-Pre: 103 %
FVC-%Change-Post: 4 %
FVC-%Pred-Post: 68 %
FVC-%Pred-Pre: 65 %
FVC-Post: 3.09 L
FVC-Pre: 2.97 L
Post FEV1/FVC ratio: 90 %
Post FEV6/FVC ratio: 100 %
Pre FEV1/FVC ratio: 84 %
Pre FEV6/FVC Ratio: 100 %
RV % pred: 83 %
RV: 1.95 L
TLC % pred: 78 %
TLC: 4.98 L

## 2023-12-10 NOTE — Patient Instructions (Signed)
 Full PFT performed today.

## 2023-12-10 NOTE — Patient Instructions (Signed)
  VISIT SUMMARY: Today, we discussed your lung condition, sleep apnea treatment, and heart health. We reviewed your recent chest x-ray and discussed your ongoing issues with your CPAP machine. We also talked about your blood pressure and heart function.  YOUR PLAN: -ABNORMAL LUNG FUNCTION WITH PRIOR ORGANIZING PNEUMONIA: Your recent chest x-ray shows clear lungs, but lung function tests indicate a mild reduction in lung volumes and diffusion capacity. This could be due to interstitial lung disease or pneumonitis that resolved with treatment. We will order a chest CT to evaluate your current lung status.  -OBSTRUCTIVE SLEEP APNEA ON CPAP THERAPY: You are feeling exhausted in the morning despite using your CPAP machine, which was recalled. We will send a message to [facility]mar[facility] Ferrari to confirm and place an order for a new CPAP machine with heated hoses to improve your sleep quality.  -DIASTOLIC DYSFUNCTION OF HEART (GRADE 2): You have grade 2 diastolic dysfunction, which means your heart's ability to relax and fill with blood is impaired, likely due to poorly controlled hypertension. We recommend following up with your primary care provider to manage your blood pressure.  -ESSENTIAL HYPERTENSION: Your blood pressure is elevated today, which is contributing to your diastolic dysfunction. You are currently taking losartan  and hydrochlorothiazide. We advise following up with your primary care provider to adjust your blood pressure medications.  INSTRUCTIONS: Please follow up with your primary care provider to manage your blood pressure and adjust your antihypertensive medications. We will also order a chest CT to evaluate your current lung status. Additionally, we will send a message to Almarie Ferrari to confirm and place an order for a new CPAP machine with heated hoses.                      Contains text generated by Abridge.                                  Contains text generated by Abridge.

## 2023-12-10 NOTE — Progress Notes (Signed)
 Full PFT performed today.

## 2023-12-10 NOTE — Progress Notes (Signed)
 Caitlin White    [id number]    [birth year]  Primary Care Physician:Murray, Leita Repine, FNP  Referring Physician: Jason Leita Repine, FNP [address] [place],  [place] [address]  Chief complaint: Follow-up for interstitial lung disease  HPI: 58 y.o. with history of unspecified interstitial lung disease, sleep apnea She has moved here from [place]  and is here to establish care Previously followed by Dr. Ivan Brazier in [place], [place]   As per the patient she had severe bilateral pneumonia in 2013 requiring hospitalizations and fluid drainage.  She developed interstitial lung disease after that but does not recall the specific details.  She has been followed by pulmonary since then for monitoring and is not on any specific treatment History also notable for sleep apnea and has been on CPAP for the past 3 years  At present denies any dyspnea, fevers, chills.  She follows Dr. EMERSON Row for evaluation of numeric hyperintense of lesions affecting vertebral bodies which is stable for 4 years.  History of total thyroidectomy for thyroid  nodules in November 2020  Has history of OSA on CPAP  --------------------------------------------  Interim history: Discussed the use of AI scribe software for clinical note transcription with the patient, who gave verbal consent to proceed.  History of Present Illness Caitlin White is a 58 year old female who presents for follow-up on her pulmonary condition and CPAP machine order.  Pulmonary parenchymal abnormalities - CT scan in December 2024 showed signs of organizing pneumonia.  Prior to that she was treated with antibiotics for lower lobe pneumonia in September 2024 - Completed several weeks of prednisone  therapy but discontinued due to concerns about immune suppression and personal dislike of the medication - No prednisone  use for the past few months - Chest x-ray in February demonstrated clear  lungs - Scheduled for a follow-up CT scan for further evaluation - Negative autoimmune disease testing in February 2025 - No known exposures or connective tissue diseases  Obstructive sleep apnea and cpap therapy - CPAP machine recalled and unable to obtain a replacement despite an order placed earlier this month - Persistent morning exhaustion despite CPAP use - Wakes up feeling extremely tired, described as feeling 'kicked' - Awaiting a new CPAP machine with heated hoses to improve sleep quality  Hypertension and cardiac function - History of hypertension managed with losartan  and hydrochlorothiazide - Previously informed of grade two diastolic dysfunction - Other aspects of cardiac function reported as normal  Relevant pulmonary history Pets: No pets Occupation: Disabled since 2021.  Used to work as a Research scientist (medical) Exposures: No mold, hot tub, Jacuzzi.  No feather pillows or comforters Smoking history: Never smoker Travel history: Originally from [place] .  Moved to [place]  in 2022 Relevant family history: No significant family history of lung disease  Received clinic notes from patient's pulmonologist Dr. Ivan Richmond dated 07/18/2020.  Review of anxiety Patient has history of interstitial lung disease, nonspecific ILD.   Open lung biopsy right lower lobe on 06/10/2012, she completed a long course of steroids with complete resolution of infiltrates  Core IR biopsy of right middle lobe nodule on 08/17/2015-patchy chronic interstitial inflammation and alveolar histiocytes, negative for granulomata, vasculitis or neoplasm.  She completed 2 months of prednisone  from April to May 2017  Outpatient Encounter Medications as of 12/10/2023  Medication Sig   acetaminophen  (TYLENOL ) 500 MG tablet Take 500 mg by mouth every 6 (six) hours as  needed for mild pain (pain score 1-3) or moderate pain (pain score 4-6).   levothyroxine  (SYNTHROID ) 200 MCG tablet Take 1 tablet (200 mcg total)  by mouth daily.   losartan -hydrochlorothiazide (HYZAAR) 100-12.5 MG tablet Take 1 tablet by mouth daily.   Multiple Vitamins-Minerals (CENTRUM ADULT PO) Take 1 tablet by mouth daily.   omeprazole  (PRILOSEC) 20 MG capsule Take 1 capsule (20 mg total) by mouth daily.   potassium chloride  (KLOR-CON  M) 10 MEQ tablet Take 1 tablet (10 mEq total) by mouth daily.   ibuprofen  (ADVIL ) 600 MG tablet Take 1 tablet (600 mg total) by mouth every 6 (six) hours as needed. (Patient not taking: Reported on 12/10/2023)   pimecrolimus  (ELIDEL ) 1 % cream Apply topically 2 (two) times daily. (Patient not taking: Reported on 12/10/2023)   predniSONE  (DELTASONE ) 10 MG tablet Take 2 tablets (20 mg total) by mouth daily with breakfast. (Patient not taking: Reported on 12/10/2023)   No facility-administered encounter medications on file as of 12/10/2023.   Vitals:   12/10/23 1125 12/10/23 1204  BP: (!) 164/92 (!) 142/82  Pulse: 72   Temp: 98.8 F (37.1 C)   Height: 6' 0.5 (1.842 m)   Weight: 264 lb (119.7 kg)   SpO2: 100%   TempSrc: Oral   BMI (Calculated): 35.29     Physical Exam GEN: No acute distress. CV: Regular rate and rhythm, no murmurs. LUNGS: Clear to auscultation bilaterally, normal respiratory effort. SKIN JOINTS: Warm and dry, no rash.  Data Reviewed: Imaging: CT abdomen pelvis 02/24/2021-visualized lung bases are clear.  Multiple pulmonary nodules measuring up to 3 mm.    CT high-resolution 04/03/2021-no interstitial lung disease, scattered sclerotic osseous lesions, 5 mm lung nodule  PET scan 04/17/2021-multifocal areas of bony sclerosis without metabolic activity  CT angiogram 01/26/2023-no PE, mild groundglass opacities in the lower lobes.  High-res CT 04/08/2023-bilateral lower lobe peribronchovascular consolidation unchanged compared to September 2024 but new from 2022  Chest x-ray 07/03/2023-no active cardiopulmonary disease I have reviewed the images personally.  PFTs: 07/04/2021 FVC  3.22 [84%], FEV1 2.95 [9 6%], F/F 92, TLC 5.40 [85%], DLCO 14.81 [54%] Isolated diffusion defect  Labs: CTD serologies 07/03/2023-negative  Assessment & Plan Abnormal lung function with prior organizing pneumonia Recent chest x-ray shows clear lungs, but lung function tests indicate mild reduction in lung volumes and diffusion capacity. Differential diagnosis includes interstitial lung disease versus pneumonitis that resolved with treatment. No autoimmune process detected as all labs were negative.  She has had a history of unspecified ILD in [place]  with open lung biopsy in 2014 but with do not have much records of that. - Order chest CT to evaluate current lung status  Obstructive sleep apnea on CPAP therapy Reports feeling exhausted in the morning despite using CPAP, indicating possible issues with the current machine. The current CPAP machine was recalled, and she requires a new machine with heated hoses. - Send message to office to confirm and place order for new CPAP machine with DME company  Diastolic dysfunction of heart (grade 2) Grade 2 diastolic dysfunction, likely related to poorly controlled hypertension. The rest of the heart function appears normal. - Advise follow-up with primary care provider to manage blood pressure  Essential hypertension Blood pressure is elevated today, contributing to diastolic dysfunction. Current medications include losartan  and hydrochlorothiazide. - Advise follow-up with primary care provider to adjust antihypertensive medications   Sclerotic bone lesions. This has been a longstanding issue per records from [place] .  Work-up here did  not show any malignancy She has been evaluated by oncology for sclerotic lesions of the bone with PET scan and bone biopsy which did not reveal any malignancy.  Plan/Recommendations: High resolution CT Ensure that she gets new CPAP machine  Lonna Coder MD [facility] 12/10/2023, 11:40  AM  CC: Jason Leita Repine,*

## 2023-12-11 ENCOUNTER — Other Ambulatory Visit

## 2023-12-11 ENCOUNTER — Telehealth: Payer: Self-pay | Admitting: Neurology

## 2023-12-11 NOTE — Telephone Encounter (Signed)
 Labs in our system are from May. Kidney function looks okay.   Called patient for clarification. She states someone from her insurance did a home visit. They tested her urine and instructed her to call us . I let her know these yearly home visits usually result in them sending information to the office. Once reviewed we will reach out if further testing is needed. She expressed understanding.   Leita GLENWOOD LIPS.   Copied from CRM [id number]. Topic: Clinical - Lab/Test Results >> Dec 11, 2023  1:29 PM [place] J wrote: Reason for CRM: Patient was instructed to call her primary to go over abnormal kidney results. The patient does not know who called her initially but she would like some more clarification if possible.

## 2023-12-15 NOTE — Telephone Encounter (Signed)
 Copied from CRM #[id number]. Topic: Clinical - Lab/Test Results >> Dec 15, 2023  8:16 AM Suzen RAMAN wrote: Reason for CRM: Patient would like a call back to discuss lab results from [employer]. Per patient they came to her home to draw labs. Patient advised no notation of information was located in the system which is why no one from the office has reached out to discuss lab results. Provided fax number to provide to [employer] to send lab results.  [phone]

## 2023-12-20 ENCOUNTER — Encounter

## 2023-12-20 ENCOUNTER — Telehealth: Payer: Self-pay

## 2023-12-20 NOTE — Telephone Encounter (Signed)
 Pt was scheduled on 12/20/23 @ 8:00PM for a sleep study with our sleep lab. Upon further investigation into the patient's chart, I seen that ordering provider requested for pt to continue OSA care through her Pulmonary office since they order CPAP supplies for her in April. Also, pt seen Pulmonary on July 31st they discussed that pt was currently waiting on new CPAP machine that they sent an order in for at a local DME and they would check on the status of the order. I discussed this with the sleep lab manager and was told to call the pt to cancel the sleep study.   I reached out to the pt via phone on 12/20/23 at 7:32PM she did not answer I lvm informing her that sleep study would be cancelled.   On 12/20/23 at 7:43PM I reached back out to the pt. This time I was able to talk to her. I informed her that I was calling regarding the sleep study that was scheduled for tonight. Pt said, that is tonight I forgot about it, and I have a headache. I told her I was calling to cancel because the provider requested in the OV on 11/11/23 to follow up with Pulmonary about continued CPAP use. I apologized for any miscommunication. Pt verbalized understanding.

## 2023-12-21 ENCOUNTER — Ambulatory Visit: Payer: Self-pay

## 2023-12-21 ENCOUNTER — Ambulatory Visit (INDEPENDENT_AMBULATORY_CARE_PROVIDER_SITE_OTHER): Admitting: Physician Assistant

## 2023-12-21 ENCOUNTER — Encounter: Payer: Self-pay | Admitting: Physician Assistant

## 2023-12-21 VITALS — BP 158/80 | HR 90 | Temp 97.8°F | Ht 72.5 in | Wt 264.0 lb

## 2023-12-21 DIAGNOSIS — R0981 Nasal congestion: Secondary | ICD-10-CM

## 2023-12-21 DIAGNOSIS — J029 Acute pharyngitis, unspecified: Secondary | ICD-10-CM

## 2023-12-21 LAB — POCT RAPID STREP A (OFFICE): Rapid Strep A Screen: NEGATIVE

## 2023-12-21 LAB — POC COVID19 BINAXNOW: SARS Coronavirus 2 Ag: NEGATIVE

## 2023-12-21 MED ORDER — PREDNISONE 20 MG PO TABS: 20.0000 mg | ORAL_TABLET | Freq: Every day | ORAL | 0 refills | Status: DC

## 2023-12-21 MED ORDER — AZELASTINE HCL 0.1 % NA SOLN: 1.0000 | Freq: Two times a day (BID) | NASAL | 0 refills | Status: AC

## 2023-12-21 NOTE — Telephone Encounter (Signed)
 Labs have been printed and placed in PCP folder for review.

## 2023-12-21 NOTE — Progress Notes (Signed)
 Established patient visit   Patient: Caitlin White   DOB: 1965/05/15   58 y.o. Female  MRN: [id number] Visit Date: 12/21/2023  Today's healthcare provider: Manuelita Flatness, PA-C   Chief Complaint  Patient presents with   Ear Pain   Sore Throat   Subjective     Discussed the use of AI scribe software for clinical note transcription with the patient, who gave verbal consent to proceed.  History of Present Illness   Ayleah Hofmeister is a 58 year old female who presents with left-sided throat pain and ear discomfort for evaluation.  She has experienced persistent pain on the left side of her throat, extending to her ear and cheek, around 3 days. The pain worsens with swallowing and is described as severe. She has not been using over-the-counter medications regularly, except for taking one Tylenol  this morning. She manages her symptoms with lozenges and mint tea.       Medications: Outpatient Medications Prior to Visit  Medication Sig   acetaminophen  (TYLENOL ) 500 MG tablet Take 500 mg by mouth every 6 (six) hours as needed for mild pain (pain score 1-3) or moderate pain (pain score 4-6).   levothyroxine  (SYNTHROID ) 200 MCG tablet Take 1 tablet (200 mcg total) by mouth daily.   losartan -hydrochlorothiazide (HYZAAR) 100-12.5 MG tablet Take 1 tablet by mouth daily.   Multiple Vitamins-Minerals (CENTRUM ADULT PO) Take 1 tablet by mouth daily.   omeprazole  (PRILOSEC) 20 MG capsule Take 1 capsule (20 mg total) by mouth daily.   potassium chloride  (KLOR-CON  M) 10 MEQ tablet Take 1 tablet (10 mEq total) by mouth daily.   [DISCONTINUED] predniSONE  (DELTASONE ) 20 MG tablet Take 20 mg by mouth daily with breakfast.   [DISCONTINUED] ibuprofen  (ADVIL ) 600 MG tablet Take 1 tablet (600 mg total) by mouth every 6 (six) hours as needed. (Patient not taking: Reported on 12/10/2023)   [DISCONTINUED] pimecrolimus  (ELIDEL ) 1 % cream Apply topically 2 (two) times daily. (Patient not taking: Reported on  12/10/2023)   [DISCONTINUED] predniSONE  (DELTASONE ) 10 MG tablet Take 2 tablets (20 mg total) by mouth daily with breakfast. (Patient not taking: Reported on 12/10/2023)   No facility-administered medications prior to visit.    Review of Systems  Constitutional:  Negative for fatigue and fever.  HENT:  Positive for ear pain and sore throat.   Respiratory:  Negative for cough and shortness of breath.   Cardiovascular:  Negative for chest pain and leg swelling.  Gastrointestinal:  Negative for abdominal pain.  Neurological:  Negative for dizziness and headaches.       Objective    BP (!) 158/80   Pulse 90   Temp 97.8 F (36.6 C) (Oral)   Ht 6' 0.5 (1.842 m)   Wt 264 lb (119.7 kg)   SpO2 99%   BMI 35.31 kg/m    Physical Exam Constitutional:      General: She is awake.     Appearance: She is well-developed.  HENT:     Head: Normocephalic.     Left Ear: Tympanic membrane normal.     Mouth/Throat:     Pharynx: Posterior oropharyngeal erythema present. No oropharyngeal exudate.     Tonsils: No tonsillar exudate. 2+ on the right. 2+ on the left.  Eyes:     Conjunctiva/sclera: Conjunctivae normal.  Cardiovascular:     Rate and Rhythm: Normal rate and regular rhythm.     Heart sounds: Normal heart sounds.  Pulmonary:     Effort: Pulmonary  effort is normal.     Breath sounds: Normal breath sounds.  Skin:    General: Skin is warm.  Neurological:     Mental Status: She is alert and oriented to person, place, and time.  Psychiatric:        Attention and Perception: Attention normal.        Mood and Affect: Mood normal.        Speech: Speech normal.        Behavior: Behavior is cooperative.      Results for orders placed or performed in visit on 12/21/23  POCT rapid strep A  Result Value Ref Range   Rapid Strep A Screen Negative Negative  POC COVID-19 BinaxNow  Result Value Ref Range   SARS Coronavirus 2 Ag Negative Negative    Assessment & Plan    Sore throat,  acute pharyngitis, viral Poc covid, strep negative. Tonsils are fairly swollen, rec 20 mg prednisone  x 5 days. Pt does not want another rx as she has a prescription of 10 mg at home that she just picked up.  Reviewed viral vs bacterial illness Encouraged fluids, rest, tylenol , warm liquids.  -     POCT rapid strep A -     POC COVID-19 BinaxNow  Nasal congestion -     Azelastine  HCl; Place 1 spray into both nostrils 2 (two) times daily. Use in each nostril as directed  Dispense: 30 mL; Refill: 0     Return if symptoms worsen or fail to improve.       Manuelita Flatness, PA-C  [facility] [phone] (phone) [phone] (fax)  [facility]

## 2023-12-21 NOTE — Telephone Encounter (Signed)
 FYI Only or Action Required?: FYI only for provider.  Patient was last seen in primary care on 09/18/2023 by Jason Leita Repine, FNP.  Called Nurse Triage reporting Otalgia.  Symptoms began this past Saturday.  Interventions attempted: OTC medications: Tylenol  and Rest, hydration, or home remedies.  Symptoms are: unchanged.  Triage Disposition: See HCP Within 4 Hours (Or PCP Triage)  Patient/caregiver understands and will follow disposition?: Yes  Copied from CRM #[id number]. Topic: Clinical - Red Word Triage >> Dec 21, 2023  8:58 AM Revonda D wrote: Red Word that prompted transfer to Nurse Triage: Pain  Pt stated that she is experiencing pain in her ears and throat. Pt stated that it hurts to swallow and when she does it hurts her ear as well. Pt would like to schedule an appt with PCP. Reason for Disposition  [1] SEVERE pain (e.g., excruciating) and [2] not improved 2 hours after pain medicine (e.g., acetaminophen  or ibuprofen )  Answer Assessment - Initial Assessment Questions 1. LOCATION: Which ear is involved?     Left ear 2. ONSET: When did the ear pain start?      Started Saturday 3. SEVERITY: How bad is the pain?  (Scale 1-10; mild, moderate or severe)     9 out of 10 4. URI SYMPTOMS: Do you have a runny nose or cough?     Slight intermittent cough 5. FEVER: Do you have a fever? If Yes, ask: What is your temperature, how was it measured, and when did it start?     no 6. CAUSE: Have you been swimming recently?, How often do you use Q-TIPS?, Have you had any recent air travel or scuba diving?     no 7. OTHER SYMPTOMS: Do you have any other symptoms? (e.g., decreased hearing, dizziness, headache, stiff neck, vomiting)     no  Protocols used: Earache-A-AH

## 2023-12-24 ENCOUNTER — Telehealth: Payer: Self-pay | Admitting: Family

## 2023-12-24 DIAGNOSIS — I10 Essential (primary) hypertension: Secondary | ICD-10-CM

## 2023-12-24 NOTE — Telephone Encounter (Signed)
 Please ask her to come get a repeat kidney function test. The results from her home visit were very different than what we have gotten here. Am suspicious her home visit may have been a lab error.

## 2023-12-30 ENCOUNTER — Ambulatory Visit
Admission: RE | Admit: 2023-12-30 | Discharge: 2023-12-30 | Disposition: A | Discharge status: Home / Self Care | Source: Ambulatory Visit | Attending: Pulmonary Disease | Admitting: Pulmonary Disease

## 2023-12-30 ENCOUNTER — Telehealth: Payer: Self-pay | Admitting: Primary Care

## 2023-12-30 DIAGNOSIS — J984 Other disorders of lung: Secondary | ICD-10-CM | POA: Diagnosis not present

## 2023-12-30 DIAGNOSIS — G4733 Obstructive sleep apnea (adult) (pediatric): Secondary | ICD-10-CM

## 2023-12-30 DIAGNOSIS — J849 Interstitial pulmonary disease, unspecified: Secondary | ICD-10-CM | POA: Diagnosis not present

## 2023-12-30 NOTE — Telephone Encounter (Signed)
 Caitlin White looks like I placed an order for CPAP machine in April along with supplies and enroll in [place]. Can we follow up on this, does it need a new order? Does she still need a new machine?

## 2023-12-30 NOTE — Telephone Encounter (Signed)
-----   Message from [place] sent at 12/10/2023  1:08 PM EDT ----- Hey Dr. Theophilus, I spoke with Leonette from [facility] for the 4/10 order. It was only for CPAP supplies-we would need an order for a CPAP machine. She did stop by [facility]'s office to pick up the Cpap supplies, and they provided her with a demo mask during the visit aswell. Let me know if there's anything else that's needed. ----- Message ----- From: Mannam, Praveen, MD Sent: 12/10/2023  11:57 AM EDT To: Caitlin LELON Ferrari, NP; Caitlin White  [name] is asking about her CPAP replacement.  Looks like an order was placed but she has not received it yet.  Can we please follow-up

## 2023-12-30 NOTE — Telephone Encounter (Signed)
 Caitlin White, it seems like [facility] didn't see the order as a CPAP order and just a CPAP supplies order. When I spoke to [name], she mentioned that the order was just for CPAP supplies. The patient would need a new CPAP machine ordered.

## 2024-01-01 NOTE — Telephone Encounter (Signed)
 Order placed

## 2024-01-04 NOTE — Telephone Encounter (Signed)
 [facility] has received the order. NFN

## 2024-01-06 ENCOUNTER — Telehealth: Payer: Self-pay | Admitting: *Deleted

## 2024-01-06 ENCOUNTER — Ambulatory Visit: Payer: Self-pay | Admitting: Family

## 2024-01-06 ENCOUNTER — Ambulatory Visit: Admitting: Student

## 2024-01-06 ENCOUNTER — Encounter: Payer: Self-pay | Admitting: Student

## 2024-01-06 VITALS — BP 138/80 | HR 82 | Temp 98.4°F | Resp 12 | Ht 72.5 in | Wt 265.8 lb

## 2024-01-06 DIAGNOSIS — L03011 Cellulitis of right finger: Secondary | ICD-10-CM

## 2024-01-06 DIAGNOSIS — S61218A Laceration without foreign body of other finger without damage to nail, initial encounter: Secondary | ICD-10-CM | POA: Diagnosis not present

## 2024-01-06 DIAGNOSIS — R911 Solitary pulmonary nodule: Secondary | ICD-10-CM

## 2024-01-06 DIAGNOSIS — J849 Interstitial pulmonary disease, unspecified: Secondary | ICD-10-CM

## 2024-01-06 MED ORDER — DOXYCYCLINE HYCLATE 100 MG PO CAPS: 100.0000 mg | ORAL_CAPSULE | Freq: Two times a day (BID) | ORAL | 0 refills | Status: AC

## 2024-01-06 NOTE — Progress Notes (Signed)
   Acute Office Visit  Subjective:     Patient ID: Caitlin White, female    DOB: Nov 13, 1965, 58 y.o.   MRN: [id number]  Chief Complaint  Patient presents with   cut on right middle finger    HPI Patient presents today for an acute visit with concern for a possible hand infection after accidentally cutting her right middle finger with a knife 2-3 days ago while reaching into the back seat of her car. Reports swelling and pain at the site, rated 4/10 pain.  Patient denies fever, chills, SOB, CP, palpitations, dyspnea, edema, HA, vision changes, N/V/D, abdominal pain, urinary symptoms, rash, weight changes, and recent illness or hospitalizations.   ROS  See HPI    Objective:    BP 138/80 (BP Location: Right Arm, Patient Position: Sitting, Cuff Size: Normal)   Pulse 82   Temp 98.4 F (36.9 C) (Oral)   Resp 12   Ht 6' 0.5 (1.842 m)   Wt 265 lb 12.8 oz (120.6 kg)   SpO2 97%   BMI 35.55 kg/m    Physical Exam  General: No acute distress. Awake and conversant.  Neck: Neck is supple. No masses or thyromegaly.  Respiratory: CTAB. Respirations are non-labored. No wheezing.  Skin: Warm. No rashes or ulcers.  Psych: Alert and oriented. CV: RRR. No murmur. No lower extremity edema.  MSK: Normal ambulation. No clubbing or cyanosis. +swelling erythema middle finger right hand, +paronychia right index finger Neuro:  CN II-XII grossly normal.    No results found for any visits on 01/06/24.      Assessment & Plan:   Problem List Items Addressed This Visit   None Visit Diagnoses       Cut of skin of middle finger    -  Primary   Relevant Medications   doxycycline  (VIBRAMYCIN ) 100 MG capsule     Paronychia of finger of right hand       Relevant Medications   doxycycline  (VIBRAMYCIN ) 100 MG capsule     Rx- Doxy BID x 7 days  Use mild soap for handwashing and keep the area clean and dry. Avoid scented products, lotions, or fragrances on the affected area until the infection  has cleared. Can use topical neosporin OTC. Tylenol  OTC for pain relief.  RTC if no improvement.  Meds ordered this encounter  Medications   doxycycline  (VIBRAMYCIN ) 100 MG capsule    Sig: Take 1 capsule (100 mg total) by mouth 2 (two) times daily for 7 days.    Dispense:  14 capsule    Refill:  0    Supervising Provider:   DOMENICA BLACKBIRD A [[id number]]    No follow-ups on file.  Brandis Matsuura L Traylen Eckels, NP

## 2024-01-06 NOTE — Telephone Encounter (Signed)
 Copied from CRM [id number]. Topic: Clinical - Lab/Test Results >> Jan 06, 2024  1:25 PM Ismael A wrote: Reason for CRM: patient called regarding Chest CT Scan, please follow up with explanation of results to [phone]  Dr. Theophilus, please advise on CT Chest results. Thank you.

## 2024-01-06 NOTE — Telephone Encounter (Signed)
 FYI Only or Action Required?: FYI only for provider.  Patient was last seen in primary care on 09/18/2023 by Jason Leita Repine, FNP.  Called Nurse Triage reporting Edema.  Symptoms began several days ago.  Interventions attempted: Nothing.  Symptoms are: unchanged.  Triage Disposition: See Physician Within 24 Hours  Patient/caregiver understands and will follow disposition?: Yes        Copied from CRM #[id number]. Topic: Clinical - Red Word Triage >> Jan 06, 2024 11:41 AM Revonda D wrote: Red Word that prompted transfer to Nurse Triage: Pain, Swelling  Pt stated that she thinks her hand is infected. Pt stated that she is experiencing swelling and pain and would like to see provider to get antibiotics. Reason for Disposition  MODERATE hand swelling (e.g., visible swelling of hand and fingers; pitting edema)  Answer Assessment - Initial Assessment Questions ONSET: When did the swelling start? (e.g., minutes, hours, days)     Not really swelling, elevated a little bit when knife went into finger; pt stuck her hand in car to get an apple and the knife cut her finger; this happened 2-3 days ago  LOCATION: What part of the hand is swollen?  Are both hands swollen or just one hand?    Right hand middle finger  TYPE : What does it look like? (e.g., ball, lump; localized; hand swelling)     Slit where knife went; when squeezing it a little elevated; looks infected a little from where pt pulled nail at edge  Denies fever  PAIN: Is the swelling painful to touch? If Yes, ask: How painful is it?   (Scale 1-10; mild, moderate or severe)     3-4/10 pain level  FEVER: Do you have a fever? If Yes, ask: What is it, how was it measured, and when did it start?      No  Protocols used: Hand Swelling-A-AH

## 2024-01-08 NOTE — Telephone Encounter (Addendum)
 Called and spoke with the patient, advised of CT results. Pt verbalized understanding, also advised CPAP order was placed to [facility] and they will contact her soon. Nfn

## 2024-01-08 NOTE — Telephone Encounter (Signed)
 Please let patient know that the CT shows minimal inflammatory nodule that appear benign. We will follow up with a CT scan in 6 months.

## 2024-01-21 ENCOUNTER — Encounter: Payer: Self-pay | Admitting: Family

## 2024-01-21 ENCOUNTER — Ambulatory Visit (INDEPENDENT_AMBULATORY_CARE_PROVIDER_SITE_OTHER): Admitting: Family

## 2024-01-21 ENCOUNTER — Ambulatory Visit: Payer: Self-pay | Admitting: Family

## 2024-01-21 VITALS — BP 138/86 | HR 65 | Ht 72.05 in | Wt 267.0 lb

## 2024-01-21 DIAGNOSIS — R7309 Other abnormal glucose: Secondary | ICD-10-CM | POA: Diagnosis not present

## 2024-01-21 DIAGNOSIS — G8929 Other chronic pain: Secondary | ICD-10-CM

## 2024-01-21 LAB — COMPREHENSIVE METABOLIC PANEL WITH GFR
ALT: 14 U/L (ref 0–35)
AST: 15 U/L (ref 0–37)
Albumin: 4 g/dL (ref 3.5–5.2)
Alkaline Phosphatase: 58 U/L (ref 39–117)
BUN: 9 mg/dL (ref 6–23)
CO2: 27 meq/L (ref 19–32)
Calcium: 9.2 mg/dL (ref 8.4–10.5)
Chloride: 103 meq/L (ref 96–112)
Creatinine, Ser: 0.8 mg/dL (ref 0.40–1.20)
GFR: 82.24 mL/min (ref >60.00)
Glucose, Bld: 103 mg/dL — ABNORMAL HIGH (ref 70–99)
Potassium: 4.2 meq/L (ref 3.5–5.1)
Sodium: 140 meq/L (ref 135–145)
Total Bilirubin: 0.5 mg/dL (ref 0.2–1.2)
Total Protein: 7.3 g/dL (ref 6.0–8.3)

## 2024-01-21 LAB — HEMOGLOBIN A1C: Hgb A1c MFr Bld: 6.4 % (ref 4.6–6.5)

## 2024-01-21 NOTE — Progress Notes (Signed)
 Caitlin White is a 58 y.o. female with the following history as recorded in Epic[employer]:  Patient Active Problem List   Diagnosis Date Noted   Body mass index (BMI) 36.0-36.9, adult 04/07/2023   Dependence on other enabling machines and devices 04/07/2023   Gastroesophageal reflux disease 04/07/2023   Iron deficiency anemia 04/07/2023   Obstructive sleep apnea 04/07/2023   Post-surgical hypothyroidism 04/07/2023   Primary osteoarthritis, unspecified ankle and foot 04/07/2023   Restless legs syndrome 04/07/2023   Disorder of thyroid  gland 04/07/2023   Arthritis    Chronic pain    Hypertension    Interstitial lung disease (HCC)    Pneumonia    Sciatica    Poor sleep hygiene 06/30/2022   Snoring 06/30/2022   Poor compliance with CPAP treatment 06/30/2022   Insomnia secondary to chronic pain 02/05/2022   Excessive daytime sleepiness 02/05/2022   History of restless legs syndrome 02/05/2022   Failed back surgical syndrome 02/05/2022   Status post complete thyroidectomy 02/05/2022   Bone lesion 04/30/2021   H/O bilateral breast reduction surgery 12/26/2015   H/O abdominal supracervical subtotal hysterectomy 04/17/2014   Pap smear abnormality of cervix with ASCUS favoring dysplasia 04/17/2014    Current Outpatient Medications  Medication Sig Dispense Refill   acetaminophen  (TYLENOL ) 500 MG tablet Take 500 mg by mouth every 6 (six) hours as needed for mild pain (pain score 1-3) or moderate pain (pain score 4-6).     levothyroxine  (SYNTHROID ) 200 MCG tablet Take 1 tablet (200 mcg total) by mouth daily. 90 tablet 1   losartan -hydrochlorothiazide (HYZAAR) 100-12.5 MG tablet Take 1 tablet by mouth daily. 90 tablet 1   Multiple Vitamins-Minerals (CENTRUM ADULT PO) Take 1 tablet by mouth daily.     omeprazole  (PRILOSEC) 20 MG capsule Take 1 capsule (20 mg total) by mouth daily. 90 capsule 1   potassium chloride  (KLOR-CON  M) 10 MEQ tablet Take 1 tablet (10 mEq total) by mouth daily. 90 tablet 1    azelastine  (ASTELIN ) 0.1 % nasal spray Place 1 spray into both nostrils 2 (two) times daily. Use in each nostril as directed (Patient not taking: Reported on 01/21/2024) 30 mL 0   No current facility-administered medications for this visit.    Allergies: Patient has no known allergies.  Past Medical History:  Diagnosis Date   Arthritis    Body mass index (BMI) 36.0-36.9, adult 04/07/2023   Bone lesion 04/30/2021   Chronic pain    Dependence on other enabling machines and devices 04/07/2023   Disorder of thyroid  gland 04/07/2023   Excessive daytime sleepiness 02/05/2022   Failed back surgical syndrome 02/05/2022   Gastroesophageal reflux disease 04/07/2023   H/O abdominal supracervical subtotal hysterectomy 04/17/2014   H/O bilateral breast reduction surgery 12/26/2015   Formatting of this note might be different from the original. 2001     Heart murmur    History of restless legs syndrome 02/05/2022   Hypertension    Insomnia secondary to chronic pain 02/05/2022   Interstitial lung disease (HCC)    Iron deficiency anemia 04/07/2023   Obstructive sleep apnea 04/07/2023   Pap smear abnormality of cervix with ASCUS favoring dysplasia 04/17/2014   Pneumonia    Poor compliance with CPAP treatment 06/30/2022   Poor sleep hygiene 06/30/2022   Post-surgical hypothyroidism 04/07/2023   Primary osteoarthritis, unspecified ankle and foot 04/07/2023   Restless legs syndrome 04/07/2023   Sciatica    Snoring 06/30/2022   Status post complete thyroidectomy 02/05/2022    Past Surgical  History:  Procedure Laterality Date   ABDOMINAL HYSTERECTOMY     BACK SURGERY     [place]ONOSCOPY  2022   [place]    No family history on file.  Social History   Tobacco Use   Smoking status: Never    Passive exposure: Never   Smokeless tobacco: Never  Substance Use Topics   Alcohol use: Never    Subjective:   Patient is asking to update the referral to neurology- this was done last October but patient  was unable to follow up as scheduled; she is also under the care of pain management in [place]; has been told that she has neuropathy in the past;   Also needs to get labs updated to check on blood sugar/ kidney functions;     Objective:  Vitals:   01/21/24 0844  BP: 138/86  Pulse: 65  SpO2: 99%  Weight: 267 lb (121.1 kg)  Height: 6' 0.05 (1.83 m)    General: Well developed, well nourished, in no acute distress  Skin : Warm and dry.  Head: Normocephalic and atraumatic  Lungs: Respirations unlabored;  Neurologic: Alert and oriented; speech intact; face symmetrical; moves all extremities well; CNII-XII intact without focal deficit   Assessment:  1. Elevated glucose   2. Other chronic pain     Plan:  History of diet controlled diabetes; check CMP, Hgba1c today;  Patient is encouraged to follow up with her pain specialist; have also updated referral back to neurology for her- this was done in 2024 but patient did not understand that she was being contacted to schedule; she is encouraged to call and schedule her own appointment.   She should plan for TOC with new PCP in 3 months;   No follow-ups on file.  Orders Placed This Encounter  Procedures   Comp Met (CMET)   Hemoglobin A1c    Requested Prescriptions    No prescriptions requested or ordered in this encounter

## 2024-01-21 NOTE — Patient Instructions (Addendum)
 We did the referral for you in October 2024 to neurology. We will update the referral for you again. You can actually call and schedule your own appointment. Their phone number is noted below-  [facility] - [facility] formerly known as [facility], clinical Neurology at [facility] [address] [place], [place] [address] [phone]    You should also consider discussing the symptoms with your pain specialist.

## 2024-02-09 ENCOUNTER — Other Ambulatory Visit: Payer: Self-pay | Admitting: Family Medicine

## 2024-02-09 ENCOUNTER — Telehealth: Payer: Self-pay | Admitting: Family

## 2024-02-09 DIAGNOSIS — K219 Gastro-esophageal reflux disease without esophagitis: Secondary | ICD-10-CM

## 2024-02-09 DIAGNOSIS — E039 Hypothyroidism, unspecified: Secondary | ICD-10-CM

## 2024-02-09 DIAGNOSIS — G629 Polyneuropathy, unspecified: Secondary | ICD-10-CM

## 2024-02-09 MED ORDER — POTASSIUM CHLORIDE CRYS ER 10 MEQ PO TBCR: 10.0000 meq | EXTENDED_RELEASE_TABLET | Freq: Every day | ORAL | 1 refills | Status: AC

## 2024-02-09 MED ORDER — LEVOTHYROXINE SODIUM 200 MCG PO TABS: 200.0000 ug | ORAL_TABLET | Freq: Every day | ORAL | 1 refills | Status: AC

## 2024-02-09 MED ORDER — OMEPRAZOLE 20 MG PO CPDR
20.0000 mg | DELAYED_RELEASE_CAPSULE | Freq: Every day | ORAL | 1 refills | Status: AC
Start: 2024-02-09 — End: ?

## 2024-02-09 NOTE — Telephone Encounter (Unsigned)
 Copied from CRM [id number]. Topic: Clinical - Medication Refill >> Feb 09, 2024  1:00 PM Suzen RAMAN wrote: Medication: levothyroxine  (SYNTHROID ) 200 MCG tablet omeprazole  (PRILOSEC) 20 MG capsule potassium chloride  (KLOR-CON  M) 10 MEQ tablet   Has the patient contacted their pharmacy? Yes   This is the patient's preferred pharmacy:  [id number] - [place], [place] - [address] [place] [address] Phone: [phone] Fax: [phone]  Is this the correct pharmacy for this prescription? Yes If no, delete pharmacy and type the correct one.   Has the prescription been filled recently? No  Is the patient out of the medication? Yes  Has the patient been seen for an appointment in the last year OR does the patient have an upcoming appointment? Yes  Can we respond through MyChart? Yes  Agent: Please be advised that Rx refills may take up to 3 business days. We ask that you follow-up with your pharmacy.

## 2024-02-09 NOTE — Telephone Encounter (Signed)
 Copied from CRM [id number]. Topic: Referral - Request for Referral >> Feb 09, 2024  1:06 PM Suzen RAMAN wrote: Did the patient discuss referral with their provider in the last year? Yes (If No - schedule appointment) (If Yes - send message)  Appointment offered? No  Type of order/referral and detailed reason for visit: Neurology-neuropathy in both legs.  Mammogram  Preference of office, provider, location: [facility] Fax Number: [phone], within the area  If referral order, have you been seen by this specialty before? No (If Yes, this issue or another issue? When? Where?  Can we respond through MyChart? Yes

## 2024-02-12 ENCOUNTER — Encounter: Payer: Self-pay | Admitting: Oncology

## 2024-02-17 NOTE — Telephone Encounter (Signed)
 Looks like mammogram was normal this summer. Not due for repeat screening yet. If having symptoms she will need to be examined first.   I'll place [facility] referral, but I have not seen her for this, so we will see if they approve.

## 2024-02-17 NOTE — Telephone Encounter (Signed)
 Patient made aware she is not due for mammogram, does not have any symptoms/need for mammogram sooner.   Aware we sent referral to neurology, but they may not accept without notes to support.

## 2024-02-19 NOTE — Progress Notes (Signed)
 Caitlin White                                          MRN: [id number]   02/19/2024   The [facility] Quality Team Specialist reviewed this patient medical record for the purposes of chart review for care gap closure. The following were reviewed: abstraction for care gap closure-controlling blood pressure.    [facility]I Quality Team
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
## 2024-03-24 ENCOUNTER — Ambulatory Visit (INDEPENDENT_AMBULATORY_CARE_PROVIDER_SITE_OTHER): Admitting: Family Medicine

## 2024-03-24 ENCOUNTER — Encounter: Payer: Self-pay | Admitting: Family Medicine

## 2024-03-24 ENCOUNTER — Telehealth: Payer: Self-pay

## 2024-03-24 VITALS — BP 130/69 | HR 90 | Ht 72.5 in | Wt 265.0 lb

## 2024-03-24 DIAGNOSIS — I10 Essential (primary) hypertension: Secondary | ICD-10-CM

## 2024-03-24 DIAGNOSIS — D509 Iron deficiency anemia, unspecified: Secondary | ICD-10-CM | POA: Diagnosis not present

## 2024-03-24 DIAGNOSIS — E039 Hypothyroidism, unspecified: Secondary | ICD-10-CM | POA: Diagnosis not present

## 2024-03-24 DIAGNOSIS — G629 Polyneuropathy, unspecified: Secondary | ICD-10-CM | POA: Diagnosis not present

## 2024-03-24 DIAGNOSIS — E119 Type 2 diabetes mellitus without complications: Secondary | ICD-10-CM

## 2024-03-24 DIAGNOSIS — G8929 Other chronic pain: Secondary | ICD-10-CM | POA: Diagnosis not present

## 2024-03-24 DIAGNOSIS — Z0000 Encounter for general adult medical examination without abnormal findings: Secondary | ICD-10-CM

## 2024-03-24 LAB — CBC WITH DIFFERENTIAL/PLATELET
Basophils Absolute: 0 K/uL (ref 0.0–0.1)
Basophils Relative: 0.6 % (ref 0.0–3.0)
Eosinophils Absolute: 0.1 K/uL (ref 0.0–0.7)
Eosinophils Relative: 2.2 % (ref 0.0–5.0)
HCT: 35.4 % — ABNORMAL LOW (ref 36.0–46.0)
Hemoglobin: 12.3 g/dL (ref 12.0–15.0)
Lymphocytes Relative: 39.2 % (ref 12.0–46.0)
Lymphs Abs: 1.6 K/uL (ref 0.7–4.0)
MCHC: 34.6 g/dL (ref 30.0–36.0)
MCV: 90.6 fl (ref 78.0–100.0)
Monocytes Absolute: 0.4 K/uL (ref 0.1–1.0)
Monocytes Relative: 9 % (ref 3.0–12.0)
Neutro Abs: 2 K/uL (ref 1.4–7.7)
Neutrophils Relative %: 49 % (ref 43.0–77.0)
Platelets: 302 K/uL (ref 150.0–400.0)
RBC: 3.91 Mil/uL (ref 3.87–5.11)
RDW: 13.2 % (ref 11.5–15.5)
WBC: 4.1 K/uL (ref 4.0–10.5)

## 2024-03-24 LAB — MICROALBUMIN / CREATININE URINE RATIO
Creatinine,U: 349.8 mg/dL
Microalb Creat Ratio: 3.7 mg/g (ref 0.0–30.0)
Microalb, Ur: 1.3 mg/dL (ref 0.0–1.9)

## 2024-03-24 LAB — TSH: TSH: 3.38 u[IU]/mL (ref 0.35–5.50)

## 2024-03-24 MED ORDER — LOSARTAN POTASSIUM-HCTZ 100-12.5 MG PO TABS: 1.0000 | ORAL_TABLET | Freq: Every day | ORAL | 1 refills | Status: AC

## 2024-03-24 NOTE — Assessment & Plan Note (Signed)
 Hypothyroidism managed with levothyroxine  200 mcg daily. - Will recheck thyroid  levels.

## 2024-03-24 NOTE — Assessment & Plan Note (Signed)
 Check CBC today.

## 2024-03-24 NOTE — Telephone Encounter (Signed)
 error

## 2024-03-24 NOTE — Assessment & Plan Note (Signed)
 Blood pressure is at goal for age and co-morbidities.   Recommendations: losartan -HCTZ 100-12.5 mg daily - BP goal <130/80 - monitor and log blood pressures at home - check around the same time each day in a relaxed setting - Limit salt to <2000 mg/day - Follow DASH eating plan (heart healthy diet) - limit alcohol to 2 standard drinks per day for men and 1 per day for women - avoid tobacco products - get at least 2 hours of regular aerobic exercise weekly Patient aware of signs/symptoms requiring further/urgent evaluation.
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
## 2024-03-25 ENCOUNTER — Ambulatory Visit: Payer: Self-pay | Admitting: Family Medicine

## 2024-03-25 NOTE — Telephone Encounter (Signed)
 Copied from CRM #[id number]. Topic: Clinical - Lab/Test Results >> Mar 25, 2024  3:34 PM Macario HERO wrote: Reason for CRM: Patient called regarding missed call. Provided lab results.

## 2024-04-04 ENCOUNTER — Encounter: Payer: Self-pay | Admitting: Oncology

## 2024-06-21 ENCOUNTER — Other Ambulatory Visit

## 2024-11-22 ENCOUNTER — Ambulatory Visit
